# Patient Record
Sex: Female | Born: 1935 | Race: Black or African American | Hispanic: No | Smoking: Never smoker
Health system: Southern US, Community
[De-identification: ages and names within clinical notes are randomized; demographics above are authoritative.]

## PROBLEM LIST (undated history)

## (undated) DIAGNOSIS — I10 Essential (primary) hypertension: Secondary | ICD-10-CM

## (undated) DIAGNOSIS — D126 Benign neoplasm of colon, unspecified: Secondary | ICD-10-CM

## (undated) DIAGNOSIS — M549 Dorsalgia, unspecified: Secondary | ICD-10-CM

## (undated) DIAGNOSIS — H40219 Acute angle-closure glaucoma, unspecified eye: Secondary | ICD-10-CM

## (undated) DIAGNOSIS — K5730 Diverticulosis of large intestine without perforation or abscess without bleeding: Secondary | ICD-10-CM

## (undated) DIAGNOSIS — K449 Diaphragmatic hernia without obstruction or gangrene: Secondary | ICD-10-CM

## (undated) DIAGNOSIS — M25561 Pain in right knee: Secondary | ICD-10-CM

## (undated) DIAGNOSIS — K219 Gastro-esophageal reflux disease without esophagitis: Secondary | ICD-10-CM

## (undated) DIAGNOSIS — D510 Vitamin B12 deficiency anemia due to intrinsic factor deficiency: Secondary | ICD-10-CM

## (undated) DIAGNOSIS — I639 Cerebral infarction, unspecified: Secondary | ICD-10-CM

## (undated) DIAGNOSIS — I4891 Unspecified atrial fibrillation: Secondary | ICD-10-CM

## (undated) DIAGNOSIS — N3281 Overactive bladder: Secondary | ICD-10-CM

## (undated) DIAGNOSIS — I69319 Unspecified symptoms and signs involving cognitive functions following cerebral infarction: Secondary | ICD-10-CM

## (undated) DIAGNOSIS — G8929 Other chronic pain: Secondary | ICD-10-CM

## (undated) HISTORY — DX: Acute angle-closure glaucoma, unspecified eye: H40.219

## (undated) HISTORY — DX: Dorsalgia, unspecified: M54.9

## (undated) HISTORY — DX: Gastro-esophageal reflux disease without esophagitis: K21.9

## (undated) HISTORY — DX: Diverticulosis of large intestine without perforation or abscess without bleeding: K57.30

## (undated) HISTORY — PX: TOTAL ABDOMINAL HYSTERECTOMY: SHX209

## (undated) HISTORY — PX: TONSILLECTOMY: SUR1361

## (undated) HISTORY — DX: Diaphragmatic hernia without obstruction or gangrene: K44.9

## (undated) HISTORY — DX: Pain in right knee: M25.561

## (undated) HISTORY — PX: APPENDECTOMY: SHX54

## (undated) HISTORY — DX: Essential (primary) hypertension: I10

## (undated) HISTORY — PX: HIP FRACTURE SURGERY: SHX118

## (undated) HISTORY — DX: Vitamin B12 deficiency anemia due to intrinsic factor deficiency: D51.0

## (undated) HISTORY — DX: Overactive bladder: N32.81

## (undated) HISTORY — DX: Benign neoplasm of colon, unspecified: D12.6

## (undated) HISTORY — DX: Other chronic pain: G89.29

---

## 2000-12-08 ENCOUNTER — Encounter: Payer: Self-pay | Admitting: Family Medicine

## 2000-12-08 ENCOUNTER — Ambulatory Visit (HOSPITAL_COMMUNITY): Admission: RE | Admit: 2000-12-08 | Discharge: 2000-12-08 | Payer: Self-pay | Admitting: Family Medicine

## 2000-12-09 ENCOUNTER — Ambulatory Visit (HOSPITAL_COMMUNITY): Admission: RE | Admit: 2000-12-09 | Discharge: 2000-12-09 | Payer: Self-pay | Admitting: Family Medicine

## 2000-12-09 ENCOUNTER — Encounter: Payer: Self-pay | Admitting: Family Medicine

## 2001-08-16 ENCOUNTER — Encounter: Payer: Self-pay | Admitting: Family Medicine

## 2001-08-16 ENCOUNTER — Ambulatory Visit (HOSPITAL_COMMUNITY): Admission: RE | Admit: 2001-08-16 | Discharge: 2001-08-16 | Payer: Self-pay | Admitting: Family Medicine

## 2002-04-11 ENCOUNTER — Emergency Department (HOSPITAL_COMMUNITY): Admission: EM | Admit: 2002-04-11 | Discharge: 2002-04-11 | Payer: Self-pay | Admitting: Emergency Medicine

## 2002-05-20 ENCOUNTER — Ambulatory Visit (HOSPITAL_COMMUNITY): Admission: RE | Admit: 2002-05-20 | Discharge: 2002-05-20 | Payer: Self-pay | Admitting: Family Medicine

## 2002-05-20 ENCOUNTER — Encounter: Payer: Self-pay | Admitting: Family Medicine

## 2003-04-21 ENCOUNTER — Ambulatory Visit (HOSPITAL_COMMUNITY): Admission: RE | Admit: 2003-04-21 | Discharge: 2003-04-21 | Payer: Self-pay | Admitting: Family Medicine

## 2003-04-21 ENCOUNTER — Encounter: Payer: Self-pay | Admitting: Family Medicine

## 2003-07-20 ENCOUNTER — Ambulatory Visit (HOSPITAL_COMMUNITY): Admission: RE | Admit: 2003-07-20 | Discharge: 2003-07-20 | Payer: Self-pay | Admitting: Family Medicine

## 2003-07-24 ENCOUNTER — Encounter: Payer: Self-pay | Admitting: Orthopedic Surgery

## 2003-09-20 ENCOUNTER — Ambulatory Visit (HOSPITAL_COMMUNITY): Admission: RE | Admit: 2003-09-20 | Discharge: 2003-09-20 | Payer: Self-pay | Admitting: Internal Medicine

## 2003-09-20 DIAGNOSIS — K5730 Diverticulosis of large intestine without perforation or abscess without bleeding: Secondary | ICD-10-CM

## 2003-09-20 HISTORY — DX: Diverticulosis of large intestine without perforation or abscess without bleeding: K57.30

## 2003-11-03 ENCOUNTER — Ambulatory Visit (HOSPITAL_COMMUNITY): Admission: RE | Admit: 2003-11-03 | Discharge: 2003-11-03 | Payer: Self-pay | Admitting: Obstetrics and Gynecology

## 2004-09-06 ENCOUNTER — Ambulatory Visit (HOSPITAL_COMMUNITY): Admission: RE | Admit: 2004-09-06 | Discharge: 2004-09-06 | Payer: Self-pay | Admitting: Family Medicine

## 2004-09-10 ENCOUNTER — Ambulatory Visit (HOSPITAL_COMMUNITY): Admission: RE | Admit: 2004-09-10 | Discharge: 2004-09-10 | Payer: Self-pay | Admitting: Family Medicine

## 2004-09-23 ENCOUNTER — Ambulatory Visit (HOSPITAL_COMMUNITY): Admission: RE | Admit: 2004-09-23 | Discharge: 2004-09-23 | Payer: Self-pay | Admitting: Family Medicine

## 2004-10-29 ENCOUNTER — Ambulatory Visit (HOSPITAL_COMMUNITY): Admission: RE | Admit: 2004-10-29 | Discharge: 2004-10-29 | Payer: Self-pay | Admitting: Family Medicine

## 2004-11-01 ENCOUNTER — Ambulatory Visit (HOSPITAL_COMMUNITY): Admission: RE | Admit: 2004-11-01 | Discharge: 2004-11-01 | Payer: Self-pay | Admitting: Thoracic Surgery

## 2004-12-09 ENCOUNTER — Ambulatory Visit (HOSPITAL_COMMUNITY): Admission: RE | Admit: 2004-12-09 | Discharge: 2004-12-09 | Payer: Self-pay | Admitting: Family Medicine

## 2004-12-17 ENCOUNTER — Ambulatory Visit (HOSPITAL_COMMUNITY): Admission: RE | Admit: 2004-12-17 | Discharge: 2004-12-17 | Payer: Self-pay | Admitting: Family Medicine

## 2005-01-01 ENCOUNTER — Encounter: Admission: RE | Admit: 2005-01-01 | Discharge: 2005-01-01 | Payer: Self-pay | Admitting: Oncology

## 2005-01-01 ENCOUNTER — Ambulatory Visit (HOSPITAL_COMMUNITY): Payer: Self-pay | Admitting: Oncology

## 2005-01-01 ENCOUNTER — Encounter (HOSPITAL_COMMUNITY): Admission: RE | Admit: 2005-01-01 | Discharge: 2005-01-31 | Payer: Self-pay | Admitting: Oncology

## 2005-01-23 ENCOUNTER — Ambulatory Visit (HOSPITAL_COMMUNITY): Admission: RE | Admit: 2005-01-23 | Discharge: 2005-01-23 | Payer: Self-pay | Admitting: Family Medicine

## 2005-02-07 ENCOUNTER — Ambulatory Visit (HOSPITAL_COMMUNITY): Admission: RE | Admit: 2005-02-07 | Discharge: 2005-02-07 | Payer: Self-pay | Admitting: Thoracic Surgery

## 2005-03-17 ENCOUNTER — Ambulatory Visit: Payer: Self-pay | Admitting: Orthopedic Surgery

## 2005-03-31 ENCOUNTER — Ambulatory Visit: Payer: Self-pay | Admitting: Orthopedic Surgery

## 2005-08-20 ENCOUNTER — Encounter: Admission: RE | Admit: 2005-08-20 | Discharge: 2005-08-20 | Payer: Self-pay | Admitting: Thoracic Surgery

## 2005-12-03 ENCOUNTER — Ambulatory Visit (HOSPITAL_COMMUNITY): Admission: RE | Admit: 2005-12-03 | Discharge: 2005-12-03 | Payer: Self-pay | Admitting: Family Medicine

## 2006-02-18 ENCOUNTER — Encounter: Admission: RE | Admit: 2006-02-18 | Discharge: 2006-02-18 | Payer: Self-pay | Admitting: Thoracic Surgery

## 2006-08-20 ENCOUNTER — Ambulatory Visit (HOSPITAL_COMMUNITY): Admission: RE | Admit: 2006-08-20 | Discharge: 2006-08-20 | Payer: Self-pay | Admitting: Family Medicine

## 2006-08-31 ENCOUNTER — Ambulatory Visit (HOSPITAL_COMMUNITY): Admission: RE | Admit: 2006-08-31 | Discharge: 2006-08-31 | Payer: Self-pay | Admitting: Family Medicine

## 2006-09-10 ENCOUNTER — Encounter: Admission: RE | Admit: 2006-09-10 | Discharge: 2006-09-10 | Payer: Self-pay | Admitting: Family Medicine

## 2006-09-25 ENCOUNTER — Encounter: Admission: RE | Admit: 2006-09-25 | Discharge: 2006-09-25 | Payer: Self-pay | Admitting: Family Medicine

## 2006-10-09 ENCOUNTER — Encounter: Admission: RE | Admit: 2006-10-09 | Discharge: 2006-10-09 | Payer: Self-pay | Admitting: Family Medicine

## 2006-10-20 IMAGING — CT CT CHEST W/ CM
1 of 2 series · 14 of 30 positions shown, 18 images · IV contrast (CONTRAST)
Comparison: none

HISTORY: Lung nodule

[Series 7530: — · axial · 0.63mm/px · z∈[+1496,+1802]mm · 14 of 73 slices shown, 18 images]
[im 6/73  mediastinal]
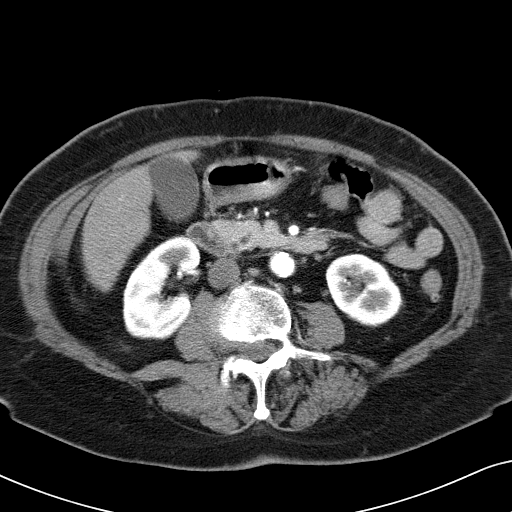
[im 6/73  lung]
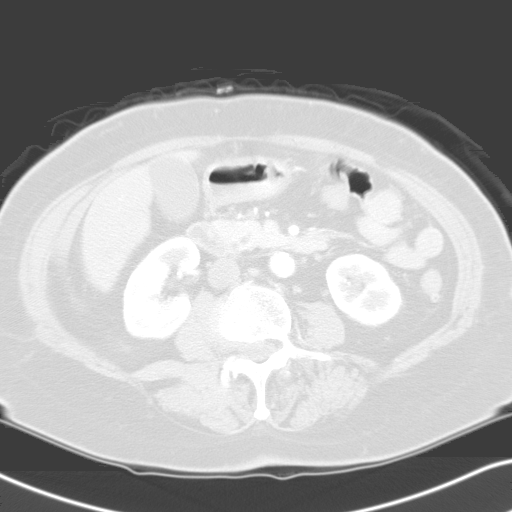
[im 11/73  lung]
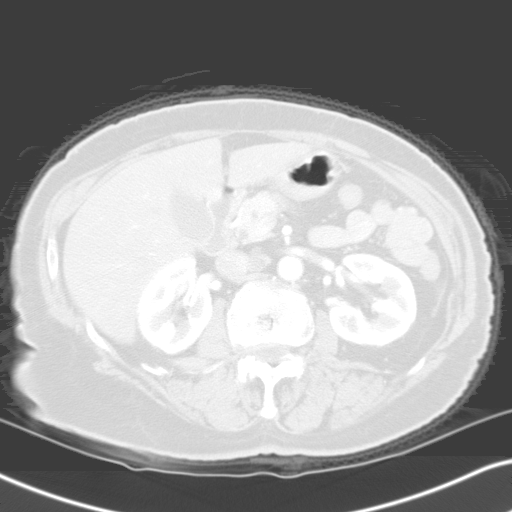
[im 16/73  lung]
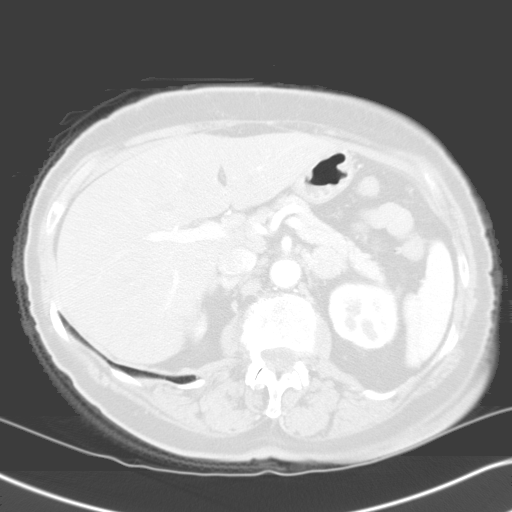
[im 21/73  lung]
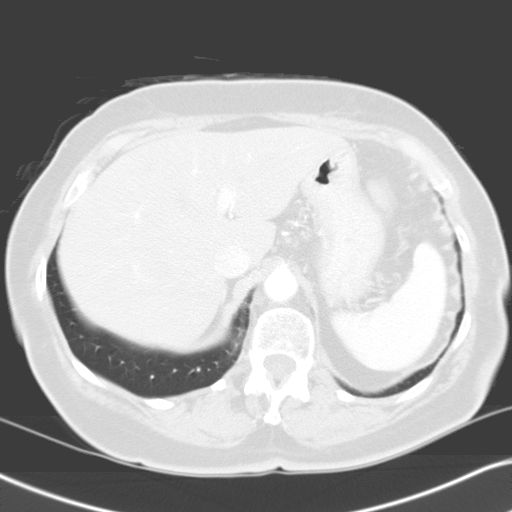
[im 26/73  mediastinal]
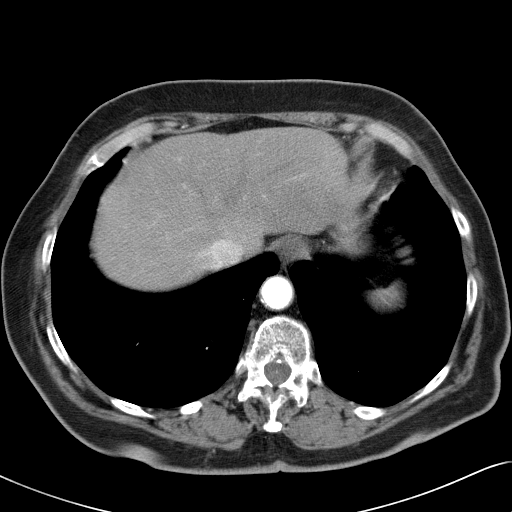
[im 26/73  lung]
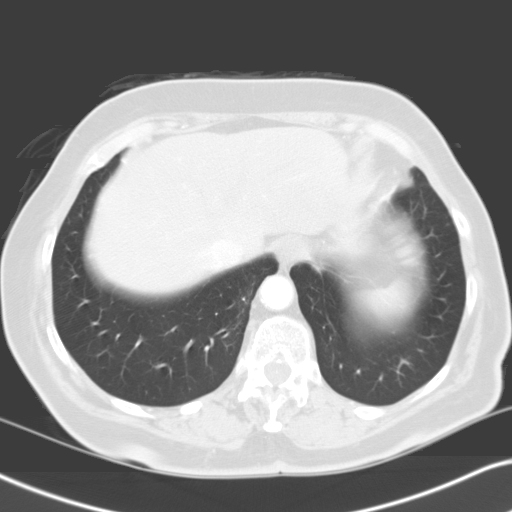
[im 31/73  lung]
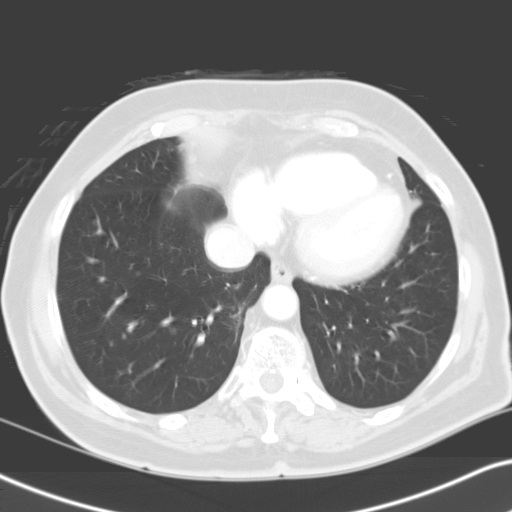
[im 35/73  lung]
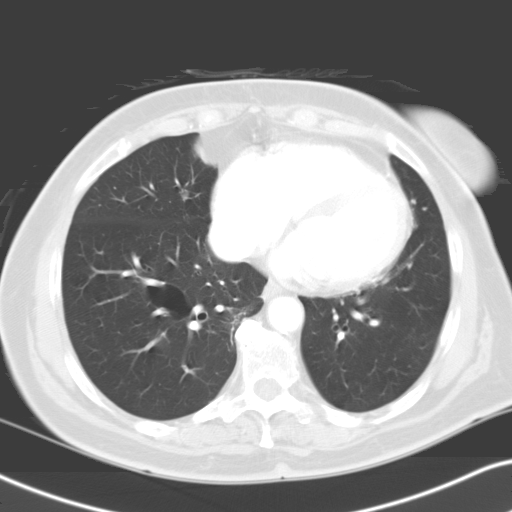
[im 37/73  lung]
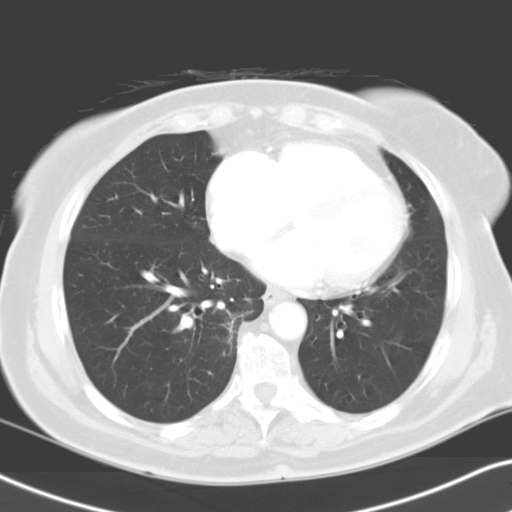
[im 42/73  mediastinal]
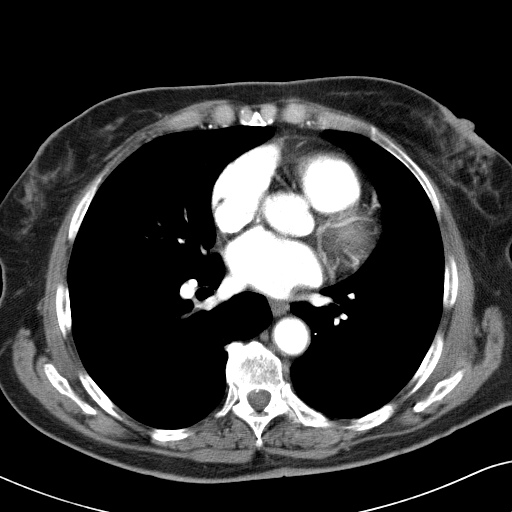
[im 42/73  lung]
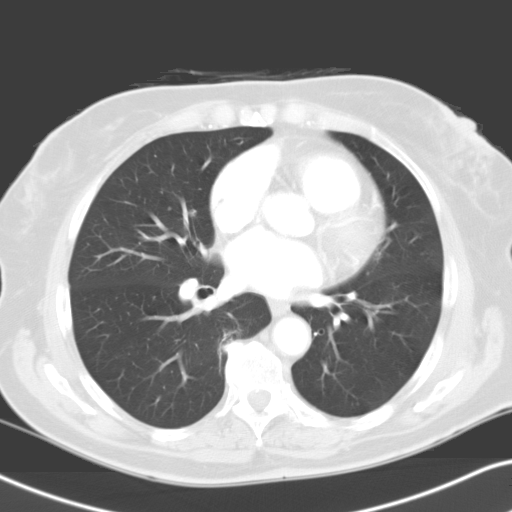
[im 47/73  lung]
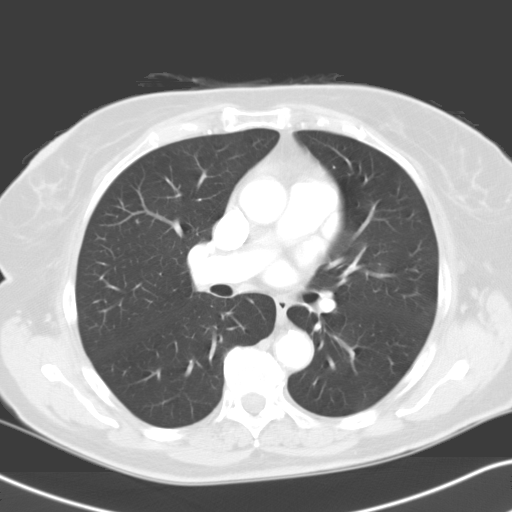
[im 52/73  lung]
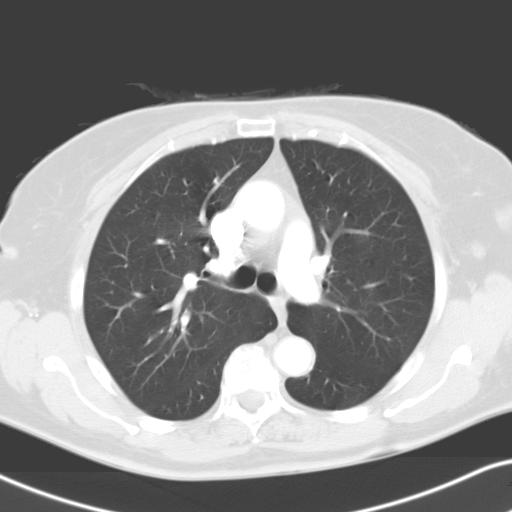
[im 57/73  lung]
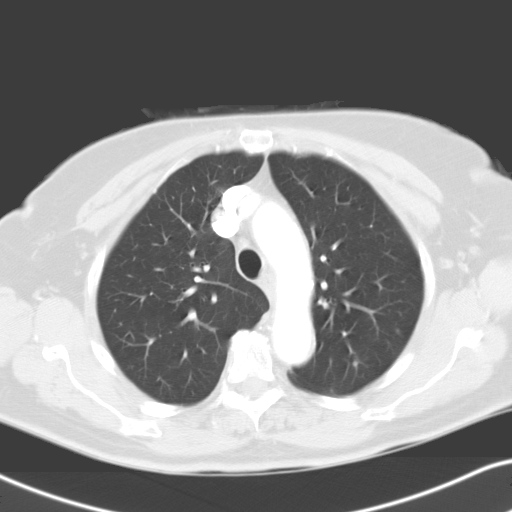
[im 62/73  mediastinal]
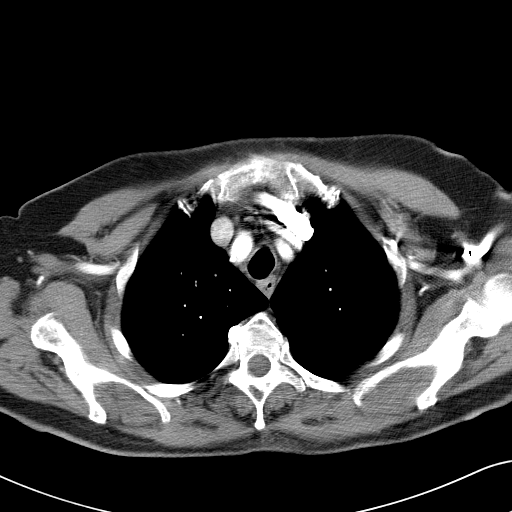
[im 62/73  lung]
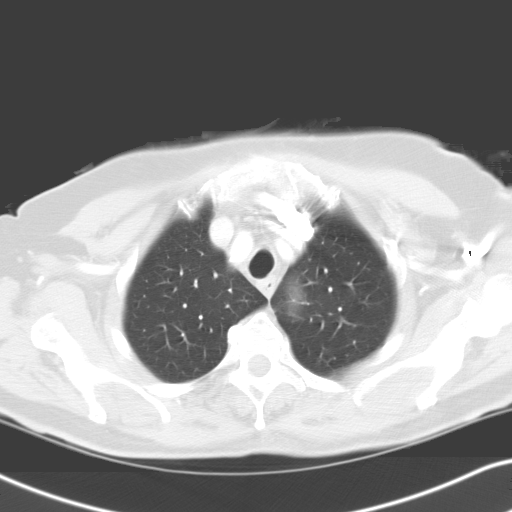
[im 67/73  lung]
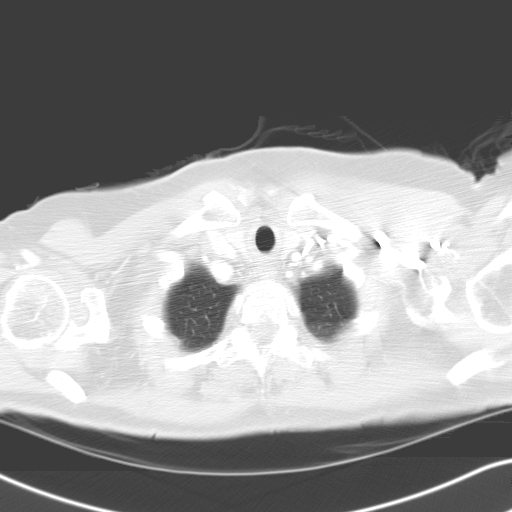

[14 of 30 positions shown; findings below may reference images not displayed]

CT CHEST WITH CONTRAST:

Multidetector helical CT imaging of chest performed.
Exam utilized 100 cc Qmnipaque-MDD.
Correlation made with prior chest radiograph of 09/06/2004, and prior CT abdomen
of 09/10/2004.

Multiple right middle lobe pulmonary nodules seen.
Largest measures 13 x 11 mm diameter, image 35, corresponding in position with
that seen in the preceding CT abdomen of 09/10/2004.
These are indeterminate for malignancy and tissue diagnosis recommended.
The remaining nodules measure 6 mm diameter, in the age 40, 5 mm diameter image
33, and 2 mm diameter image 32.
Remaining lungs clear.
Small bleb is seen centrally in the right lower lobe.
No pulmonary infiltrate or pleural effusion.
Bilateral normal sized axillary lymph nodes.
No mediastinal adenopathy.
Enlarged aortocaval lymph node in the upper abdomen, [DATE] x 1.2 cm, image 64.
Left adrenal mass again identified, 2.5 x 1.9 cm, image 57.
Remainder of upper abdomen unremarkable.
IMPRESSION: 4 right middle lobe nodules, cannot exclude malignancy. Tissue diagnosis
recommended. The small size of these lesions makes these more likely to be
amenable to biopsy via VATS than percutaneously.
Stable left adrenal mass.
Enlarged aortocaval lymph nodes.

## 2006-11-04 ENCOUNTER — Ambulatory Visit (HOSPITAL_COMMUNITY): Admission: RE | Admit: 2006-11-04 | Discharge: 2006-11-04 | Payer: Self-pay | Admitting: Family Medicine

## 2007-01-13 ENCOUNTER — Inpatient Hospital Stay (HOSPITAL_COMMUNITY): Admission: EM | Admit: 2007-01-13 | Discharge: 2007-01-21 | Payer: Self-pay | Admitting: *Deleted

## 2007-01-13 ENCOUNTER — Ambulatory Visit: Payer: Self-pay | Admitting: Cardiology

## 2007-01-13 ENCOUNTER — Ambulatory Visit: Payer: Self-pay | Admitting: Orthopedic Surgery

## 2007-01-15 ENCOUNTER — Encounter: Payer: Self-pay | Admitting: Orthopedic Surgery

## 2007-01-21 ENCOUNTER — Inpatient Hospital Stay: Admission: AD | Admit: 2007-01-21 | Discharge: 2007-04-05 | Payer: Self-pay | Admitting: Internal Medicine

## 2007-02-04 ENCOUNTER — Ambulatory Visit (HOSPITAL_COMMUNITY): Admission: RE | Admit: 2007-02-04 | Discharge: 2007-02-04 | Payer: Self-pay | Admitting: Internal Medicine

## 2007-02-17 ENCOUNTER — Ambulatory Visit (HOSPITAL_COMMUNITY): Admission: RE | Admit: 2007-02-17 | Discharge: 2007-02-17 | Payer: Self-pay | Admitting: Internal Medicine

## 2007-02-18 ENCOUNTER — Ambulatory Visit: Payer: Self-pay | Admitting: Orthopedic Surgery

## 2007-04-22 ENCOUNTER — Ambulatory Visit: Payer: Self-pay | Admitting: Orthopedic Surgery

## 2007-07-01 ENCOUNTER — Ambulatory Visit: Payer: Self-pay | Admitting: Orthopedic Surgery

## 2007-07-01 DIAGNOSIS — IMO0002 Reserved for concepts with insufficient information to code with codable children: Secondary | ICD-10-CM

## 2007-07-01 DIAGNOSIS — M25569 Pain in unspecified knee: Secondary | ICD-10-CM

## 2007-09-22 ENCOUNTER — Ambulatory Visit: Payer: Self-pay | Admitting: Orthopedic Surgery

## 2007-09-22 DIAGNOSIS — M549 Dorsalgia, unspecified: Secondary | ICD-10-CM | POA: Insufficient documentation

## 2007-11-16 ENCOUNTER — Ambulatory Visit (HOSPITAL_COMMUNITY): Admission: RE | Admit: 2007-11-16 | Discharge: 2007-11-16 | Payer: Self-pay | Admitting: Family Medicine

## 2008-01-18 ENCOUNTER — Encounter: Payer: Self-pay | Admitting: Orthopedic Surgery

## 2008-04-18 ENCOUNTER — Ambulatory Visit (HOSPITAL_COMMUNITY): Admission: RE | Admit: 2008-04-18 | Discharge: 2008-04-18 | Payer: Self-pay | Admitting: Family Medicine

## 2008-09-07 ENCOUNTER — Ambulatory Visit: Payer: Self-pay | Admitting: Orthopedic Surgery

## 2008-09-07 DIAGNOSIS — M1710 Unilateral primary osteoarthritis, unspecified knee: Secondary | ICD-10-CM | POA: Insufficient documentation

## 2008-10-12 ENCOUNTER — Ambulatory Visit (HOSPITAL_COMMUNITY): Admission: RE | Admit: 2008-10-12 | Discharge: 2008-10-12 | Payer: Self-pay | Admitting: Family Medicine

## 2008-12-22 ENCOUNTER — Ambulatory Visit (HOSPITAL_COMMUNITY): Admission: RE | Admit: 2008-12-22 | Discharge: 2008-12-22 | Payer: Self-pay | Admitting: Family Medicine

## 2008-12-22 ENCOUNTER — Encounter: Payer: Self-pay | Admitting: Orthopedic Surgery

## 2009-01-02 ENCOUNTER — Ambulatory Visit: Payer: Self-pay | Admitting: Orthopedic Surgery

## 2009-04-16 ENCOUNTER — Ambulatory Visit: Payer: Self-pay | Admitting: Orthopedic Surgery

## 2009-09-20 ENCOUNTER — Ambulatory Visit: Payer: Self-pay | Admitting: Orthopedic Surgery

## 2009-09-20 DIAGNOSIS — M5430 Sciatica, unspecified side: Secondary | ICD-10-CM | POA: Insufficient documentation

## 2009-09-20 DIAGNOSIS — M25559 Pain in unspecified hip: Secondary | ICD-10-CM | POA: Insufficient documentation

## 2010-07-30 ENCOUNTER — Ambulatory Visit (HOSPITAL_COMMUNITY): Admission: RE | Admit: 2010-07-30 | Discharge: 2010-07-30 | Payer: Self-pay | Admitting: Cardiovascular Disease

## 2010-07-30 ENCOUNTER — Encounter (INDEPENDENT_AMBULATORY_CARE_PROVIDER_SITE_OTHER): Payer: Self-pay | Admitting: Cardiovascular Disease

## 2010-09-22 ENCOUNTER — Encounter: Payer: Self-pay | Admitting: Otolaryngology

## 2010-10-01 NOTE — Assessment & Plan Note (Signed)
Summary: rt hip pain post THA needs xrays/bcbs/medicare.cbt   Visit Type:  Follow-up  CC:  right hip pain.  History of Present Illness: This is a 77 year old female who had a RIGHT hip fracture which was treated with gamma nail (short version) on Jan 15, 2007.  She is currently on Tylox for chronic pain.  She fell on her hip 3 weeks ago when her legs gave out on her.  She landed on her back area.  She is at a limp since that fall.  She has pain which radiates from her back across the hip into the knee and into the shin.  But there is no numbness just pain.  She denies any groin pain.  Physical exam She is actually groomed, oriented x3 pleasant mood ambulating with a cane she has tenderness in her RIGHT lower back, RIGHT gluteal region.  She is nontender over the greater trochanter.  The incision looks great.  Range of motion of the hip remains normal.  The hip remained stable.  Muscle tone and strength in the thigh normal.  She is normal temperature and the limb.  Lymph nodes are normal in the RIGHT groin and sensation is normal in the RIGHT leg and reflexes are normal with no pathologic signs on the RIGHT lower extremity straight leg raise reproduces some of her symptoms.  Her coordination balance may be a little off but this is most likely related to age  X-rays 3 views of the spine and an AP pelvis show that the RIGHT hip implant is in excellent alignment with no signs of loosening or change.  Impression normal appearance for a gamma nail with healing of the fracture  The spine view show a degenerative disc disease with scoliosis and kyphosis.  This is chronic.  There is no change from previous films.  Impression stable degenerative scoliosis.  Assessment bruised or contused sciatic nerve  Recommend observation.      Allergies (verified): No Known Drug Allergies  Past History:  Past Surgical History: Last updated: 06/29/2007 Appendectomy Total Abdominal  Hysterectomy Tonsillectomy OTIF gamma nail right hip Dr. Romeo Apple  Social History: she lives around and drives and still takes care of herself  Review of Systems      See HPI   Impression & Recommendations:  Problem # 1:  SCIATICA (ICD-724.3) Assessment New  x-rays are obtained of the lumbar spine in complaint to previous films. There is a scoliosis and a severe degenerative disc disease throughout the lumbar spine kyphosis and a L1, old compression fracture. The gamma nail in the RIGHT hip is stable. No change in position.  The following medications were removed from the medication list:    Lorcet Plus 7.5-650 Mg Tabs (Hydrocodone-acetaminophen) ..... One by mouth q 4 hrs as needed pain Her updated medication list for this problem includes:    Nabumetone 500 Mg Tabs (Nabumetone) ..... One by mouth bid    Tylox 5-500 Mg Caps (Oxycodone-acetaminophen) ..... One by mouth q 4 hrs as needed pain  Orders: Est. Patient Level IV (57846) Lumbosacral Spine ,2/3 views (72100)  Problem # 2:  HIP PAIN (ICD-719.45) Assessment: Comment Only  The following medications were removed from the medication list:    Lorcet Plus 7.5-650 Mg Tabs (Hydrocodone-acetaminophen) ..... One by mouth q 4 hrs as needed pain Her updated medication list for this problem includes:    Nabumetone 500 Mg Tabs (Nabumetone) ..... One by mouth bid    Tylox 5-500 Mg Caps (Oxycodone-acetaminophen) ..... One by  mouth q 4 hrs as needed pain  Orders: Pelvis x-ray, 1/2 views (16109)  Medications Added to Medication List This Visit: 1)  Tylox 5-500 Mg Caps (Oxycodone-acetaminophen) .... One by mouth q 4 hrs as needed pain  Patient Instructions: 1)  You have a bruised sciatic nerve.  The hip implant is fine.  2)  Return as needed.

## 2010-11-18 ENCOUNTER — Other Ambulatory Visit (HOSPITAL_COMMUNITY): Payer: Self-pay | Admitting: Family Medicine

## 2010-11-18 DIAGNOSIS — Z139 Encounter for screening, unspecified: Secondary | ICD-10-CM

## 2010-11-21 ENCOUNTER — Ambulatory Visit (HOSPITAL_COMMUNITY)
Admission: RE | Admit: 2010-11-21 | Discharge: 2010-11-21 | Disposition: A | Discharge status: Home / Self Care | Payer: Medicare Other | Source: Ambulatory Visit | Attending: Family Medicine | Admitting: Family Medicine

## 2010-11-21 DIAGNOSIS — Z1231 Encounter for screening mammogram for malignant neoplasm of breast: Secondary | ICD-10-CM | POA: Insufficient documentation

## 2010-11-21 DIAGNOSIS — Z139 Encounter for screening, unspecified: Secondary | ICD-10-CM

## 2010-12-02 ENCOUNTER — Encounter: Payer: Self-pay | Admitting: Urgent Care

## 2010-12-02 ENCOUNTER — Ambulatory Visit (INDEPENDENT_AMBULATORY_CARE_PROVIDER_SITE_OTHER): Payer: Medicare Other | Admitting: Urgent Care

## 2010-12-02 VITALS — BP 159/80 | HR 70 | Temp 99.3°F | Ht 65.0 in | Wt 116.0 lb

## 2010-12-02 DIAGNOSIS — R630 Anorexia: Secondary | ICD-10-CM

## 2010-12-02 DIAGNOSIS — R634 Abnormal weight loss: Secondary | ICD-10-CM

## 2010-12-02 NOTE — Assessment & Plan Note (Signed)
77-year-old female with 20+ pound weight loss over the past year. This has been unintentional, although she has had decreased caloric intake over the last 3 years since she had a hip fracture. Last colonoscopy approximately 7 years ago. She is having some anorexia. She denies any other chronic GI problems. She will need EGD and to rule out peptic ulcer disease occult malignancy and colonoscopy to rule out occult malignancy with Dr. Rourk.   I have discussed risks & benefits which include, but are not limited to, bleeding, infection, perforation & drug reaction.  The patient agrees with this plan & written consent will be obtained.    

## 2010-12-02 NOTE — Assessment & Plan Note (Deleted)
77 year old female with 20+ pound weight loss over the past year. This has been unintentional, although she has had decreased caloric intake over the last 3 years since she had a hip fracture. Last colonoscopy approximately 7 years ago. She is having some anorexia. She denies any other chronic GI problems. She will need EGD and to rule out peptic ulcer disease occult malignancy and colonoscopy to rule out occult malignancy with Dr. Jena Gauss.   I have discussed risks & benefits which include, but are not limited to, bleeding, infection, perforation & drug reaction.  The patient agrees with this plan & written consent will be obtained.

## 2010-12-02 NOTE — Patient Instructions (Addendum)
Start Multivitamin daily Drink Boost, Ensure or Carnation Instant Breakfast if you don't eat snacks between meals Try & eat 3 meals and at least 2 snacks per day  High Protein/High Calorie Diet A high protein/high calorie diet increases the amount of protein and calories in the foods you eat. You may need more protein and calories in your diet because of illness, surgery, injury, weight loss, or have a poor appetite. Eating protein and calorie rich foods can help you gain weight, heal and recover after illness.  SERVING SIZES: Measuring foods and serving sizes helps to make sure you are getting the right amount of food. The list below tells how big or small some common serving sizes are.   1 ounce (oz) of cheese or 1  oz cheese 3 or 4 stacked dice   2-3 oz cooked meat Deck of cards   1 teaspoon (tsp) Tip of little finger   1 tablespoon (Tbsp) Tip of thumb   2 Tbsp Golf ball    Cup Half of a fist   1 Cup A fist  HIGH PROTEIN FOODS: Dairy Sources  Whole milk.  Whole milk yogurt.   Powdered milk.   Cheese.  [employer].   Instant breakfast products.  Eggnog.   Tips for adding to/using in diet:  Use whole milk when making hot cereal, puddings, soups and hot cocoa.   Add powdered milk to baked goods and smoothies or milkshakes.   Make whole milk yogurt parfaits by adding granola, fruit or nuts.   Add cheese to sandwiches, pastas, soups and casseroles.   Add fruit to cottage cheese.  Meat Sources  Beef, pork and poultry.  Fish and seafood.   Peanut butter.   Dried beans.  Eggs.   Tips for adding to/using in diet:  Make meat and cheese omelets   Add eggs to salads and baked goods   Add fish and seafood to salads   Add meat and poultry to casseroles, salads, and soups   Use peanut butter as a topping for pretzels, celery, crackers or add to baked goods   Use beans in casseroles and dips or spreads  GENERAL GUIDELINES TO INCREASE CALORIES:  Replace  calorie free drinks with calorie containing drinks such as milk, fruit juices, regular soda, milkshakes, and hot chocolate.   Try to eat 6 small meals instead of 3 large meals each day.   Keep snacks handy such as nuts, trail mixes, dried fruit, and yogurt.   Choose foods with sauces and gravies to increase calories.   Add dried fruits, honey, and half and half to hot or cold cereal.   Add extra fats when possible such as butter, sour cream, cream cheese, and salad dressings   Sprinkle or add cheese to foods often.   Consider adding a clear liquid nutritional supplement to your diet. Your caregiver can give you recommendations.  HIGH CALORIE FOODS: Grain/Starch Sources  Baked goods such as muffins and quick breads.  Croissants.   Pancakes and waffles.  Vegetable Sources  Sauted vegetables in oil.   Fried vegetables.   Salad greens with regular salad dressing or vinegar and oil.  Fruit Sources  Dried fruit.  Canned fruit in syrup.  Fruit juice.  Fat Sources  Avocado.  Butter or margarine.   Whipped cream.   Mayonnaise.  Salad dressing.   Peanuts and mixed nuts.  Cream cheese and sour cream.   Sweets and Dessert Sources  Cake.  Cookies.   Pie.  Ice cream.   Donuts and pastries.   Protein and meal replacement bars.  Jam, preserves and jelly.   Candy bars.   Chocolate.   Chocolate, caramel, or various syrups.   Document Released: 08/18/2005 Document Re-Released: 06/14/2009 [facility] Patient Information 2011 [place].

## 2010-12-02 NOTE — Progress Notes (Signed)
Referring Provider: Colette Ribas, MD Primary Care Physician:  Colette Ribas, MD  Chief Complaint  Patient presents with  . Weight Loss    unexplained    HPI:  Renee Leblanc is a 77 y.o. female here as a referral from Dr. Phillips Odor for unintentional wt loss of 25# in last year.  Pt only eats BID because she awakens at 10am, eats breakfast & then dinner 5-6pm.  Does not snack throughout the day.  c/o anorexia.  Denies nausea, vomiting, heartburn or indigestion. Takes prilosec 20mg  daily.  Denies dysphagia or odynophagia.  BM normal once daily, denies rectal bleeding or melena.  Takes colace 100mg  daily x 3 yrs.  C/o increased urinary frequency.  Denies any hx DM.   Past Medical History  Diagnosis Date  . Chronic back pain     3 yrs  . Knee pain, right   . Pernicious anemia   . Overactive bladder   . Diverticulosis of colon 09/20/2003    Last colonoscopy Dr Jena Gauss , inflammatory polyps  . Hemorrhoids   . Acute angle-closure glaucoma   . Hypertension   . GERD (gastroesophageal reflux disease)   . Osteoporosis     Past Surgical History  Procedure Date  . Appendectomy   . Total abdominal hysterectomy   . Tonsillectomy   . Hip fracture surgery     Current Outpatient Prescriptions  Medication Sig Dispense Refill  . alendronate (FOSAMAX) 70 MG tablet Take 70 mg by mouth every 7 (seven) days. Take with a full glass of water on an empty stomach.       . calcium carbonate 200 MG capsule Take 250 mg by mouth daily.        Marland Kitchen docusate sodium (COLACE) 100 MG capsule Take 100 mg by mouth daily.        . folic acid (FOLVITE) 1 MG tablet Take 1 mg by mouth daily.        . furosemide (LASIX) 20 MG tablet Take 20 mg by mouth daily.        . irbesartan (AVAPRO) 300 MG tablet Take 300 mg by mouth at bedtime.        Marland Kitchen latanoprost (XALATAN) 0.005 % ophthalmic solution Place 1 drop into both eyes at bedtime.        . lidocaine (LIDODERM) 5 % Place 1 patch onto the skin daily. Remove  & Discard patch within 12 hours or as directed by MD       . metoprolol (LOPRESSOR) 50 MG tablet Take 50 mg by mouth daily.        Marland Kitchen omeprazole (PRILOSEC) 20 MG capsule Take 20 mg by mouth daily.        Marland Kitchen oxyCODONE-acetaminophen (TYLOX) 5-500 MG per capsule Take 1 capsule by mouth every 4 (four) hours as needed.        . potassium chloride SA (K-DUR,KLOR-CON) 20 MEQ tablet Take 20 mEq by mouth daily.        . Timolol Maleate (TIMOPTIC OP) Apply to eye.        . Vitamin B1-B12 5.5-0.0125 MG/5ML SOLN Inject 1 Syringe as directed every 30 (thirty) days.        . Bisoprolol-Hydrochlorothiazide (ZIAC PO) Take by mouth.        . nabumetone (RELAFEN) 500 MG tablet Take 500 mg by mouth 2 (two) times daily.        Marland Kitchen DISCONTD: AmLODIPine Besylate (NORVASC PO) Take by mouth.  Allergies as of 12/02/2010  . (No Known Allergies)    Family History:  There is no known family history of colorectal carcinoma , liver disease, or inflammatory bowel disease.   History   Social History  . Marital Status: Widowed    Spouse Name: N/A    Number of Children: 1  . Years of Education: N/A   Occupational History  . retired    Social History Main Topics  . Smoking status: Never Smoker   . Smokeless tobacco: Never Used  . Alcohol Use: Yes     wine each night before dinner  . Drug Use: No  . Sexually Active: No   Other Topics Concern  . Not on file   Social History Narrative   Lives in [place] alone    Review of Systems: Gen: Denies any fever, chills, sweats, anorexia, fatigue, weakness, malaise, and sleep disorder CV: Denies chest pain, angina, palpitations, syncope, orthopnea, PND, peripheral edema, and claudication. Resp: Denies dyspnea at rest, dyspnea with exercise, cough, sputum, wheezing, coughing up blood, and pleurisy. GI: Denies vomiting blood, jaundice, and fecal incontinence.   Denies dysphagia or odynophagia. GU : c/o OAB and increased urinary frequency.  Denies urinary  burning, blood in urine,urinary hesitancy, nocturnal urination, and urinary incontinence. MS: Denies joint pain, limitation of movement, and swelling, stiffness, low back pain, extremity pain. Denies muscle weakness, cramps, atrophy.  Derm: Denies rash, itching, dry skin, hives, moles, warts, or unhealing ulcers.  Psych: Denies depression, anxiety, memory loss, suicidal ideation, hallucinations, paranoia, and confusion. Heme: Denies bruising, bleeding, and enlarged lymph nodes.  Physical Exam: BP 159/80  Pulse 70  Temp 99.3 F (37.4 C)  Ht 5\' 5"  (1.651 m)  Wt 116 lb (52.617 kg)  BMI 19.30 kg/m2  SpO2 96% General:   Alert,  Well-developed, well-nourished, pleasant and cooperative in NAD Head:  Normocephalic and atraumatic.  Alopecia. Eyes:  Sclera clear, no icterus.   Conjunctiva pink. Ears:  Normal auditory acuity. Nose:  No deformity, discharge,  or lesions. Mouth:  No deformity or lesions, dentition normal. Neck:  Supple; no masses or thyromegaly. Lungs:  Clear throughout to auscultation.   No wheezes, crackles, or rhonchi. No acute distress. Heart:  Regular rate and rhythm; no murmurs, clicks, rubs,  or gallops. Abdomen:  Soft, nontender and nondistended. No masses, hepatosplenomegaly or hernias noted. Normal bowel sounds, without guarding, and without rebound.   Rectal:  Deferred until time of colonoscopy.   Msk:  Symmetrical without gross deformities. Normal posture. Pulses:  Normal pulses noted. Extremities:  Without clubbing or edema. Neurologic:  Alert and  oriented x4;  grossly normal neurologically. Skin:  Intact without significant lesions or rashes. Cervical Nodes:  No significant cervical adenopathy. Psych:  Alert and cooperative. Normal mood and affect.

## 2010-12-03 NOTE — Progress Notes (Signed)
Reviewed by R. Michael Kerstin Crusoe, MD FACP FACG 

## 2010-12-18 ENCOUNTER — Other Ambulatory Visit: Payer: Self-pay | Admitting: Internal Medicine

## 2010-12-18 ENCOUNTER — Ambulatory Visit (HOSPITAL_COMMUNITY)
Admission: RE | Admit: 2010-12-18 | Discharge: 2010-12-18 | Disposition: A | Discharge status: Home / Self Care | Payer: Medicare Other | Source: Ambulatory Visit | Attending: Internal Medicine | Admitting: Internal Medicine

## 2010-12-18 ENCOUNTER — Encounter: Payer: Medicare Other | Admitting: Internal Medicine

## 2010-12-18 DIAGNOSIS — D126 Benign neoplasm of colon, unspecified: Secondary | ICD-10-CM

## 2010-12-18 DIAGNOSIS — R634 Abnormal weight loss: Secondary | ICD-10-CM | POA: Insufficient documentation

## 2010-12-18 DIAGNOSIS — K449 Diaphragmatic hernia without obstruction or gangrene: Secondary | ICD-10-CM

## 2010-12-18 DIAGNOSIS — D131 Benign neoplasm of stomach: Secondary | ICD-10-CM

## 2010-12-18 HISTORY — DX: Benign neoplasm of colon, unspecified: D12.6

## 2010-12-18 HISTORY — DX: Diaphragmatic hernia without obstruction or gangrene: K44.9

## 2010-12-19 ENCOUNTER — Telehealth: Payer: Self-pay

## 2010-12-19 NOTE — Telephone Encounter (Signed)
Noted; lets  make it marinol 2.5 mg orally BID #60 with no refills; o/v

## 2010-12-19 NOTE — Telephone Encounter (Signed)
Informed Harrold Donath at [facility]

## 2010-12-19 NOTE — Telephone Encounter (Signed)
[facility] called [facility] rx Marinol 0.5mg . rx only comes in 2.5mg , 5mg , or 10mg . Pharmacist wants to clarify what dose pt should be on. Op note states 0.5mg . Please advise.

## 2010-12-22 NOTE — Op Note (Signed)
NAME:  Renee Leblanc, Renee Leblanc              ACCOUNT NO.:  [id number]  MEDICAL RECORD NO.:  [id number]           PATIENT TYPE:  O  LOCATION:  [facility]                          FACILITY:  [facility]  PHYSICIAN:  R. Roetta Sessions, M.D. DATE OF BIRTH:  08/20/1917  DATE OF PROCEDURE:  12/18/2010 DATE OF DISCHARGE:                              OPERATIVE REPORT   PROCEDURE:  Diagnostic EGD followed by colonoscopy with biopsy and snare polypectomy.  INDICATIONS FOR PROCEDURE:  A 77 year old lady with 25-pound unintentional weight loss over the past 1 year.  Denies other chronic GI symptoms.  Last colonoscopy 7 years ago.  EGD and colonoscopy now being done.  Risks, benefits, alternatives, limitations have been reviewed previously again at the bedside.  All parties questions answered, all parties agreeable.  PROCEDURE NOTE:  O2 saturation, blood pressure, pulse, respirations were monitored throughout the entire procedure.  CONSCIOUS SEDATION:  Versed 3 mg IV, Demerol 75 mg IV in divided doses.  INSTRUMENT:  Pentax video chip system.  Cetacaine spray for topical pharyngeal anesthesia.  FINDINGS:  EGD examination of tubular esophagus revealed normal- appearing mucosa.  EG junction easily traversed.  Stomach:  Gastric cavity was emptied and insufflated well with air. Thorough examination of gastric mucosa including retroflexion of proximal stomach, esophagogastric junction demonstrated only a small hiatal hernia, couple of tiny fundal gland polyps not manipulated. Pylorus was patent, easily traversed.  Examination of bulb and second portion revealed no abnormalities.  THERAPEUTIC/DIAGNOSTIC MANEUVERS PERFORMED:  None.  The patient tolerated the procedure well and was prepared for colonoscopy.  Digital rectal exam revealed no abnormalities.  Endoscopic findings:  Prep was good.  Colon:  Colonic mucosa was surveyed from the rectosigmoid junction through the left transverse right colon to  the appendiceal orifice, ileocecal valve/cecum.  These structures were well seen and photographed for the record.  From this level, scope was slowly and cautiously withdrawn.  All previously mucosal surfaces were again seen.  The patient was noted to have a two diminutive polyps in the mid ascending colon which were cold biopsied/removed.  There was a 6-mm polyp on a stalk in the mid descending colon which was cold snared, recovered.  Remainder of colonic mucosa appeared normal.  Scope was pulled down to the rectum where a thorough examination of rectal mucosa including retroflexed view of the anal verge demonstrated internal hemorrhoids only.  The patient tolerated this procedure well.  Also cecal withdrawal time 8 minutes.  IMPRESSION: 1. EGD normal, esophagus small hiatal hernia, fundal gland polyp was     not manipulated, otherwise normal stomach, patent pylorus, normal     D1 and D2.  Colonoscopy findings internal hemorrhoids, anal papilla     otherwise normal rectum. 2. Polyps in descending segments removed as described above, otherwise     normal-appearing colon.  I suspect the patient's symptoms of anorexia and weight loss are multifactorial etiology i.e. advanced age change in metabolism, etc. Today's findings are reassuring.  RECOMMENDATIONS: 1. Liberalize oral intake, consider Carnation as breakfast supplements     between meals. 2. One month course of Marinol 0.5 mg orally b.i.d. 3. We would  consider GYN imaging just to make sure that she does not     have any evidence of an occult very neoplasm, etc, that could be an     insidious occult factor in this setting (I doubt). 4. Follow up on path. 5. Follow up appointment with [place] in the office.     Jonathon Bellows, M.D.     RMR/MEDQ  D:  12/18/2010  T:  12/19/2010  Job:  [id number]  cc:   Corrie Mckusick, M.D. Fax: [phone]  Electronically Signed by Lorrin Goodell M.D. on 12/22/2010 02:08:31 PM

## 2010-12-23 NOTE — Telephone Encounter (Signed)
Pt is aware of OV on 01/17/11 @ 1030 w/LSL

## 2010-12-26 ENCOUNTER — Encounter: Payer: Self-pay | Admitting: Internal Medicine

## 2011-01-14 NOTE — Op Note (Signed)
NAME:  Renee Leblanc, Renee Leblanc NO.:  [id number]   MEDICAL RECORD NO.:  [id number]          PATIENT TYPE:  INP   LOCATION:  A206                          FACILITY:  [facility]   PHYSICIAN:  Vickki Hearing, M.D.DATE OF BIRTH:  Sep 01, 1917   DATE OF PROCEDURE:  01/15/2007  DATE OF DISCHARGE:                               OPERATIVE REPORT   HISTORY:  This is an 77 year old female who fell when she was chased by  some dogs and injured her right hip.  She could not walk. She was  brought to the hospital by emergency medical systems and was found to  have a right hip fracture.  She was admitted.  A medical consult was  obtained.  She developed some chest pain, a cardiology consult was  obtained. EKG, enzymes, CT the chest to rule out PE and aortic aneurysm  were all negative. She was cleared for surgery.   PREOPERATIVE DIAGNOSIS:  Intertrochanteric fracture right hip.   POSTOPERATIVE DIAGNOSIS:  Intertrochanteric fracture right hip.   PROCEDURE:  Open treatment internal fixation with intramedullary device,  Gamma nail from the Stryker system, 125 degrees angled nail, 35 mm  locking bolt, 95 mm lag screw, and a proximal locking nut.   SURGEON:  Vickki Hearing, M.D.   ASSISTANT:  None.   ANESTHESIA:  Spinal.   BLOOD LOSS:  Less than 150 mL.   OPERATIVE FINDINGS:  Two part intertrochanteric fracture of the right  hip.   The patient was identified as Renee Leblanc.  Her right hip was marked  for surgery.  I countersigned it.  I checked her x-rays, history and  physical, and agreed that this was a right hip fracture.  She was taken  to the operating room for spinal anesthetic.  The first one was  unsuccessful.  The second one was successful.  She was placed on the  operating fracture table.  Her left leg was placed in a well leg holder,  the right leg placed in traction. Traction and internal rotation were  used to reduce the fracture and radiographs confirmed the  fracture  reduction. The time out procedure was completed and the antibiotics were  started on time.   A straight incision was made at the tip of the trochanter and extended  proximally.  Subcutaneous tissue and fascia were divided in line with  the skin incision.  Blunt muscular dissection was done until the greater  trochanter was palpated. The curved awl was passed into the femoral  canal.  A guidewire was passed and confirmed by x-ray. The reamer was  passed over the guidewire and the nail was passed over the guidewire.  The guidewire was removed.  A stab incision was made on the lateral  thigh.  A drill sleeve was placed against the bone. A guidewire was  placed just at the inferior third of the femoral head and middle on the  lateral view and measured a 95. We set the triple reamer for 95, over  reamed the wire to 95 mm, passed the lag screw over the guidewire, and  then passed the  locking nut proximally, backed it off a half a turn to  allow some sliding.  We then removed the screw driver handle, made a  stab wound more distal, passed a drill sleeve, drilled across the  locking screw site, and passed a screw after drilling to a depth of 35  mm.  We passed the screw, secured it, took x-rays of everything, it was  in excellent position.  We irrigated all the wounds, closed in layered  fashion, staples for the skin, 30 mL of Marcaine with epinephrine  injected subcu and deep. Dressings were applied and the patient was  taken to recovery room in stable condition.  She is full weight bearing.  Lovenox and early mobilization for DVT prophylaxis.  Sequential  compression devices, as well. Continue to follow medically.      Vickki Hearing, M.D.  Electronically Signed     SEH/MEDQ  D:  01/15/2007  T:  01/16/2007  Job:  [id number]

## 2011-01-14 NOTE — Discharge Summary (Signed)
NAME:  Renee Leblanc, Renee Leblanc              ACCOUNT NO.:  [id number]   MEDICAL RECORD NO.:  [id number]          PATIENT TYPE:  INP   LOCATION:  A340                          FACILITY:  [facility]   PHYSICIAN:  Vickki Hearing, M.D.DATE OF BIRTH:  October 30, 1916   DATE OF ADMISSION:  01/13/2007  DATE OF DISCHARGE:  LH                               DISCHARGE SUMMARY   ADDENDUM   I have spoken with the medical physician regarding D-dimer.  The patient  clinically not symptomatic for clot or PE, however, the test was  positive and a CT scan will be done to rule out PE.  If it is normal,  then the patient can be discharged today.  If note, then we will go to a  dose-related Lovenox for treatment.      Vickki Hearing, M.D.  Electronically Signed     SEH/MEDQ  D:  01/20/2007  T:  01/20/2007  Job:  [id number]

## 2011-01-14 NOTE — Consult Note (Signed)
Renee Leblanc, Renee Leblanc              ACCOUNT NO.:  [id number]   MEDICAL RECORD NO.:  [id number]          PATIENT TYPE:  INP   LOCATION:  A340                          FACILITY:  [facility]   PHYSICIAN:  Gerrit Friends. Dietrich Pates, MD, FACCDATE OF BIRTH:  [birth year]   DATE OF CONSULTATION:  01/15/2007  DATE OF DISCHARGE:                                 CONSULTATION   PRIMARY CARE PHYSICIAN:  Dr. Nobie Putnam.   HISTORY OF PRESENT ILLNESS:  Request for consultation greatly  appreciated concerning this 77 year old woman admitted with hip fracture  and reporting intermittent chest discomfort.  Renee Leblanc has enjoyed  generally excellent health.  She has no known cardiovascular disease and  other than hypertension has never been evaluated by cardiologist nor  undergone any significant cardiac testing.  She reports vague and poorly  characterized and mild chest discomfort that is diffuse over the  anterior chest.  There is no radiation.  There are no associated  symptoms.  The discomfort is typically brought on or exacerbated by  lying supine and resolves when she changes position in bed or rubs the  affected area.  There is no chest wall soreness.  There is no pleuritic  component.  She reports no exertional symptoms.   PAST MEDICAL HISTORY:  Notable for GERD, glaucoma, and hyperlipidemia.  She is under treatment for rheumatoid arthritis.   ALLERGIES:  The patient has no known drug allergies.   MEDICATIONS ON ADMISSION:  1. Methotrexate 2.5 mg q. week.  2. Folate 1 mg daily.  3. Benicar 40 mg daily.  4. Ziac 2.5/6.25 mg daily.  5. Nexium 40 mg daily.  6. Tylox 5/500 mg p.r.n.  7. Fosamax 70 mg q. week.  8. Demadex 20 mg daily p.r.n.  9. Lidoderm patches p.r.n.  10.Xalatan eye drops.  11.Timoptic eye drops.   SOCIAL HISTORY:  No use of alcohol; remote history of cigarette smoking  of less than 20 pack years.  Lives alone.   FAMILY HISTORY:  No prominent coronary disease.   REVIEW OF SYSTEMS:   Notable for a history of osteoarthritis,  hyperlipidemia, and glaucoma.   REMOTE OPERATIONS:  Tonsillectomy, appendectomy, and hysterectomy.  The  patient reports occasional and unpredictable episodes of dyspnea.  She  has chronic back pain.  She requires corrective lenses for near and far  vision.  She has partial dentures.  She has intermittent pedal edema.  All other systems reviewed and are negative.   PHYSICAL EXAMINATION:  GENERAL:  Pleasant woman lying immobile in bed in  no apparent pain when she is not moving.  VITAL SIGNS:  Blood pressure 140/80, heart rate 90 and regular,  respirations 20, O2 saturation 96% on room air.  HEENT:  Bilateral arcus; EOMs full; normal oral mucosa.  Decreased  hearing acuity.  NECK:  No jugular venous distension; normal carotid upstrokes without  bruits.  ENDOCRINE:  No thyromegaly.  HEMATOPOIETIC:  No adenopathy.  SKIN:  No significant lesions.  CARDIAC:  Normal first and second heart sounds; prominent fourth heart  sounds; normal PMI.  ABDOMEN:  Soft and nontender; normal bowel sounds; no  masses; no  organomegaly.  EXTREMITIES:  Trace edema; normal distal pulses.  NEUROMUSCULAR:  Symmetric strength and tone; normal cranial nerves.   EKG:  Normal sinus rhythm; nonspecific anterior T-wave abnormality;  nondiagnostic Q wave in lead III.  No prior tracing for comparison.   LABORATORY DATA:  Other laboratory notable for minimal hypokalemia with  potassium 3.4.  BUN and creatinine are normal.  CBC is normal.   IMPRESSION:  Renee Leblanc has very atypical symptoms of both vague chest  discomfort and intermittent dyspnea.  She has good exercise tolerance,  and generally benign history and no significant abnormalities on initial  laboratory testing.  Since surgical repair of her fracture is clearly  necessary and since further preoperative testing is unlikely to reduce  her risk of surgery which is primarily attributable to her advanced age,  I  would recommend proceeding with surgery forthwith.  We will be happy  to assist with her care perioperatively.      Gerrit Friends. Dietrich Pates, MD, [facility]  Electronically Signed     RMR/MEDQ  D:  01/20/2007  T:  01/20/2007  Job:  [id number]   cc:   Vickki Hearing, M.D.  Fax: [phone]

## 2011-01-14 NOTE — H&P (Signed)
NAME:  Renee Leblanc, Renee Leblanc              ACCOUNT NO.:  [id number]   MEDICAL RECORD NO.:  [id number]          PATIENT TYPE:  INP   LOCATION:  A340                          FACILITY:  [facility]   PHYSICIAN:  Vickki Hearing, M.D.DATE OF BIRTH:  [birth year]   DATE OF ADMISSION:  01/13/2007  DATE OF DISCHARGE:  LH                              HISTORY & PHYSICAL   CHIEF COMPLAINT:  Pain, right hip.   HISTORY:  This is an 77 year old female with history of osteoarthritis,  who happens to be on methotrexate, folic acid, Benicar, Ziac, Nexium,  Tylox, Fosamax, Demadex p.r.n., Lidoderm patches, Xalatan eye drops.   She presents with a history of hypertension, gastroesophageal reflux,  glaucoma, hyperlipidemia.  No history of rheumatoid arthritis.  Denies  any smoking or drinking, drug abuse.  Has NO DRUG ALLERGIES.  She  previously had a colonoscopy and snare polypectomy in 2005, by Dr.  Kendell Bane. Had fell and broke her hip.   She gives a history that she was being chased by dogs, fell onto her  right hip and could not get up and walk.  Some neighbors brought her  into the house, though could not stand on her leg and was brought to the  emergency room.  Radiographs show a 2-part intertrochanteric fracture of  the right fracture; minimal displacement, slight rotatory deformity.   REVIEW OF SYSTEMS:  She complains of pain in her right wrist,  intermittent swelling of the ankles, back pain, leg pain bilaterally,  some numbness and tingling in the legs as well.  Neurologic,  Psychiatric, Skin, Musculoskeletal, GI, Respiratory, Cardiovascular,  ENT, Constitutional symptoms are otherwise negative.   MEDICATIONS:  Are as stated.   CLINICAL EXAMINATION:  VITAL SIGNS:  Temperature 98.5, pulse 88,  respiratory rate 20, blood pressure 163/86, room air saturation 99%.  Height 65 inches.  Weight 59 kg.  APPEARANCE:  Normal.  She was awake and alert. She was oriented x3.  Mood and affect were normal.  HEENT:  She is hard of hearing.  Ears, nose and throat are otherwise  normal.  NECK:  Cervical spine nontender, no masses.  CHEST:  Clear.  CARDIAC:  Heart rate and rhythm normal.  ABDOMEN:  Soft, nondistended, nontender.  EXTREMITIES:  Upper extremities she has some scrapes over the right arm  and forearm.  No cuts though.  She does not have a rheumatoid-associated  drift, so I do not think she has rheumatoid arthritis.  She may have  some osteoarthritic changes in the hand, which are more likely.  Muscle  tone is normal.  Good grip strength is noted.  No atrophy.  No  contractures, subluxations, atrophy.  Tremors are noted in the upper  extremities.   She did not have edema or swelling in the legs today. The right leg is  in traction.  The left leg there is some flexion contraction noted in  the knee, crepitance in flexion; but, she has functional range of motion  (5-120).  No instability is noted.  No pain is noted on range of motion  of the hip or ankle.  Right hip is tender over the greater trochanter.  There is no bruising  there as yet.  NEUROVASCULAR EXAMINATION:  Extremities were intact.   DIAGNOSTIC TESTING:  The plain films show 2-part intratrochanteric  fracture, nondisplaced.  Slight rotation abnormality is noted.  There is  a bone spur at the inferior margin of the femoral head, indicating that  there was some osteoarthritic changes going on in the right hip.   The chest film was read out as no acute disease.  Lung hyperaeration.   LABORATORY RESULTS:  White count 8, hemoglobin 13, platelet count 243.  Sodium 142, potassium 3.4, chloride 105, CO2 30, glucose 111, BUN 14,  creatinine 0.72.  Liver function tests normal.  Total protein 6.3,  albumin 3.8, calcium 9.2.   EKG:  Pending.   IMPRESSION:  A 2-part intratrochanteric fracture.   PLAN:  Internal fixation of right hip, with a gamma nail.   I have discussed this with the patient, her son and daughter.   Reviewed  the potential complications, including nonunion, infection, screw cut  out, DVT, leg length discrepancy and need for cane or walker, pulmonary  embolus, deep vein thrombosis.   Plan for surgery on Thursday, Jan 14, 2007 (if OR time is available).      Vickki Hearing, M.D.  Electronically Signed     SEH/MEDQ  D:  01/13/2007  T:  01/13/2007  Job:  [id number]

## 2011-01-14 NOTE — Discharge Summary (Signed)
NAME:  Renee Leblanc, Renee Leblanc              ACCOUNT NO.:  [id number]   MEDICAL RECORD NO.:  [id number]          PATIENT TYPE:  INP   LOCATION:  A340                          FACILITY:  [facility]   PHYSICIAN:  Vickki Hearing, M.D.DATE OF BIRTH:  1917-03-17   DATE OF ADMISSION:  01/13/2007  DATE OF DISCHARGE:  05/22/2008LH                               DISCHARGE SUMMARY   ADMISSION DIAGNOSIS:  Fractured right hip.   DISCHARGE DIAGNOSIS:  Fractured right hip.   PROCEDURE:  May 16, the patient underwent short Gamma nail fixation with  Stryker Gamma nail 125 degree angle, 35 mm locking bolt, 95 lag screw  and proximal locking nut in sliding mode.  Surgery was done under spinal  with less than 150 mL blood loss.   OPERATIVE FINDINGS:  Two part intertrochanteric fracture with stable  posteromedial buttress.   HISTORY:  This 77 year old female was out near her mailbox and was  chased by dogs, fell and fractured her right hip.  She was brought into  the hospital and prepped for surgery.  On the 15th surgery was cancelled  because the patient complained of chest pain.  Medical and cardiac  consultations were performed.  Workup was negative.  She was placed on  telemetry and observed.  On the 16th she underwent surgery with no  complications, postoperatively did well, did complain of some shortness  of breath on the night of the 19th.  Second group of cardiac enzymes  were ordered.  They were also normal.  On Jan 19, 2007 she had a D-dimer  ordered and the results were 4.91 which are high and suggest pulmonary  embolus although the patient has been on Lovenox throughout the hospital  course even before surgery.  Her last chem-7 on the 19th sodium 139,  potassium 4.1, chloride 108, glucose 112, BUN and creatinine 10 and 0.6.  Hemoglobin 11 and platelet count 219.   DISCHARGE INSTRUCTIONS:  1. Take staples out on May 26.  2. Patient is full weightbearing as tolerated.  3. Follow-up in the office in  a month.   MED LIST:  1. Colace 100 mg twice daily.  2. Folic acid 1 mg daily.  3. Avapro 300 mg daily.  4. Xalatan eye ophthalmic solution o. u. at bedtime.  5. Milk of magnesia 30 mL daily p.r.n.  6. Skelaxin 800 mg by mouth every six hours as needed.  7. Methotrexate 2.5 mg by mouth on Mondays.  8. Lopressor 50 mg by mouth daily.  9. Protonix 80 mg by mouth daily w. c.  10.K-Dur 20 mEq twice daily.  11.Darvocet one every four hours as needed for pain.   The patient has a bed at the [facility] pending cardiac and medical  recommendations regarding the high level of d-dimer.  Clinically the  patient has no signs of a deep venous thrombosis.  I will leave that in  their capable hands to recommend follow-up and treatment regarding that.      Vickki Hearing, M.D.  Electronically Signed     SEH/MEDQ  D:  01/20/2007  T:  01/20/2007  Job:  [id number]

## 2011-01-17 ENCOUNTER — Ambulatory Visit: Payer: Medicare Other | Admitting: Gastroenterology

## 2011-01-17 ENCOUNTER — Encounter: Payer: Self-pay | Admitting: Urgent Care

## 2011-01-17 ENCOUNTER — Ambulatory Visit (INDEPENDENT_AMBULATORY_CARE_PROVIDER_SITE_OTHER): Payer: Medicare Other | Admitting: Urgent Care

## 2011-01-17 VITALS — BP 151/83 | HR 66 | Temp 97.6°F | Ht 65.0 in | Wt 118.4 lb

## 2011-01-17 DIAGNOSIS — R634 Abnormal weight loss: Secondary | ICD-10-CM

## 2011-01-17 LAB — COMPREHENSIVE METABOLIC PANEL
AST: 16 U/L (ref 0–37)
BUN: 22 mg/dL (ref 6–23)
Calcium: 10.3 mg/dL (ref 8.4–10.5)
Chloride: 102 mEq/L (ref 96–112)
Creat: 1.07 mg/dL (ref 0.40–1.20)
Total Bilirubin: 0.5 mg/dL (ref 0.3–1.2)

## 2011-01-17 LAB — CBC WITH DIFFERENTIAL/PLATELET
Basophils Absolute: 0 10*3/uL (ref 0.0–0.1)
Basophils Relative: 0 % (ref 0–1)
Eosinophils Absolute: 0.1 10*3/uL (ref 0.0–0.7)
Eosinophils Relative: 2 % (ref 0–5)
HCT: 40.3 % (ref 36.0–46.0)
MCH: 29.6 pg (ref 26.0–34.0)
MCHC: 32.5 g/dL (ref 30.0–36.0)
MCV: 91 fL (ref 78.0–100.0)
Monocytes Absolute: 0.7 10*3/uL (ref 0.1–1.0)
RDW: 13.6 % (ref 11.5–15.5)

## 2011-01-17 NOTE — Progress Notes (Signed)
Referring Provider: Colette Ribas, MD Primary Care Physician:  Colette Ribas, MD Primary Gastroenterologist:  Dr. Jena Gauss  Chief Complaint  Patient presents with  . Weight Loss    HPI:  Renee Leblanc is a 77 y.o. female here for follow up for weight loss.  Benign EGD & colonoscopy by Dr Jena Gauss 12/12/10.  Feels well.  Denies any GI concerns.  Only eating 2 very small, very healthy meals per day, sleeps late.  Very active social life, ie plays bridge, going out to eat, etc.  Does not snack during the day.  Dr Jena Gauss had given marinol to her at procedures but it does not appear that she is taking this.  Not taking boost, carnation, ensure as suggested.  Still drives her own vehicle.  Past Medical History  Diagnosis Date  . Chronic back pain     3 yrs  . Knee pain, right   . Pernicious anemia   . Overactive bladder   . Diverticulosis of colon 09/20/2003    colonoscopy Dr Jena Gauss , inflammatory polyps  . Hemorrhoids   . Acute angle-closure glaucoma   . Hypertension   . GERD (gastroesophageal reflux disease)   . Osteoporosis   . Tubular adenoma of colon 12/18/10    Last colonosocpy Dr Jena Gauss 2 TA removed, anal papillae, internal hemorrhoids  . Hiatal hernia 12/18/10    On last EGD Dr Elmer Ramp otherwise    Past Surgical History  Procedure Date  . Appendectomy   . Total abdominal hysterectomy   . Tonsillectomy   . Hip fracture surgery     Current Outpatient Prescriptions  Medication Sig Dispense Refill  . alendronate (FOSAMAX) 70 MG tablet Take 70 mg by mouth every 7 (seven) days. Take with a full glass of water on an empty stomach.       Marland Kitchen AmLODIPine Besylate (NORVASC PO) Take by mouth.        . Bisoprolol-Hydrochlorothiazide (ZIAC PO) Take by mouth.        . calcium carbonate 200 MG capsule Take 250 mg by mouth daily.        Marland Kitchen docusate sodium (COLACE) 100 MG capsule Take 100 mg by mouth daily.        . folic acid (FOLVITE) 1 MG tablet Take 1 mg by mouth daily.        .  furosemide (LASIX) 20 MG tablet Take 20 mg by mouth daily.        . irbesartan (AVAPRO) 300 MG tablet Take 300 mg by mouth at bedtime.        Marland Kitchen latanoprost (XALATAN) 0.005 % ophthalmic solution Place 1 drop into both eyes at bedtime.        . lidocaine (LIDODERM) 5 % Place 1 patch onto the skin daily. Remove & Discard patch within 12 hours or as directed by MD       . metoprolol (LOPRESSOR) 50 MG tablet Take 50 mg by mouth daily.        . nabumetone (RELAFEN) 500 MG tablet Take 500 mg by mouth 2 (two) times daily.        Marland Kitchen omeprazole (PRILOSEC) 20 MG capsule Take 20 mg by mouth daily.        Marland Kitchen oxyCODONE-acetaminophen (TYLOX) 5-500 MG per capsule Take 1 capsule by mouth every 4 (four) hours as needed.        . potassium chloride SA (K-DUR,KLOR-CON) 20 MEQ tablet Take 20 mEq by mouth daily.        Marland Kitchen  Timolol Maleate (TIMOPTIC OP) Apply to eye.        . Vitamin B1-B12 5.5-0.0125 MG/5ML SOLN Inject 1 Syringe as directed every 30 (thirty) days.          Allergies as of 01/17/2011  . (No Known Allergies)    Review of Systems: Gen: Denies any fever, chills, sweats, anorexia, fatigue, weakness, malaise, weight loss, and sleep disorder CV: Denies chest pain, angina, palpitations, syncope, orthopnea, PND, peripheral edema, and claudication. Resp: Denies dyspnea at rest, dyspnea with exercise, cough, sputum, wheezing, coughing up blood, and pleurisy. GI: Denies vomiting blood, jaundice, and fecal incontinence.   Denies dysphagia or odynophagia. Derm: Denies rash, itching, dry skin, hives, moles, warts, or unhealing ulcers.  Psych: Denies depression, anxiety, memory loss, suicidal ideation, hallucinations, paranoia, and confusion. Heme: Denies bruising, bleeding, and enlarged lymph nodes.  Physical Exam: BP 151/83  Pulse 66  Temp(Src) 97.6 F (36.4 C) (Temporal)  Ht 5\' 5"  (1.651 m)  Wt 118 lb 6.4 oz (53.706 kg)  BMI 19.70 kg/m2 General:   Alert,  Elderly female.  Well-developed, well-nourished,  pleasant and cooperative in NAD Head:  Normocephalic and atraumatic. Eyes:  Sclera clear, no icterus.   Conjunctiva pink. Mouth:  No deformity or lesions, dentition normal. Neck:  Supple; no masses or thyromegaly. Heart:  Regular rate and rhythm; no murmurs, clicks, rubs,  or gallops. Abdomen:  Soft, nontender and nondistended. No masses, hepatosplenomegaly or hernias noted. Normal bowel sounds, without guarding, and without rebound.   Msk:  Symmetrical without gross deformities. Normal posture. Pulses:  Normal pulses noted. Extremities:  Without clubbing or edema. Neurologic:  Alert and  oriented x4;  grossly normal neurologically. Skin:  Intact without significant lesions or rashes. Cervical Nodes:  No significant cervical adenopathy. Psych:  Alert and cooperative. Normal mood and affect.

## 2011-01-17 NOTE — Patient Instructions (Signed)
Eat snacks/meals every 2-3 hrs each day

## 2011-01-17 NOTE — Op Note (Signed)
NAME:  Renee Leblanc, Renee Leblanc                        ACCOUNT NO.:  [id number]   MEDICAL RECORD NO.:  [id number]                   PATIENT TYPE:  AMB   LOCATION:  DAY                                  FACILITY:  [facility]   PHYSICIAN:  R. Roetta Sessions, M.D.              DATE OF BIRTH:  August 23, 1917   DATE OF PROCEDURE:  09/20/2003  DATE OF DISCHARGE:                                 OPERATIVE REPORT   PROCEDURE:  Colonoscopy with snare polypectomy.   INDICATIONS FOR PROCEDURE:  The patient is an 77 year old lady who comes for  colorectal cancer screening.  She had a sigmoidoscopy back in 2000 without  any significant findings.  She is here for a full colonoscopy.  She has  never had her entire lower GI tract imaged.  She is devoid of any lower GI  tract symptoms.  There is no family history of colorectal neoplasia.  The  approach of colonoscopy was discussed with the patient at length.  The  potential risks, benefits, and alternatives have been reviewed and questions  answered.  She is agreeable.  Please see my handwritten H&P for more  information.   PROCEDURE:  O2 saturation, blood pressure, pulses, and respirations were  monitored throughout the entire procedure.  Conscious sedation was with  Versed 1 mg IV, Demerol 25 mg IV in divided doses.  The instrument used was  the Olympus video chip pediatric colonoscope.   FINDINGS:  Digital rectal examination revealed no abnormalities.   ENDOSCOPIC FINDINGS:  The prep was good.   Rectum:  Examination of the rectal mucosa including retroflex view of the  anal verge revealed internal hemorrhoids and anal papilla.  Otherwise  normal.   Colon:  The colonic mucosa was surveyed from the rectosigmoid junction  through the left, transverse, right colon to the area of the appendiceal  orifice, ileocecal valve, and cecum.  These structures were well-seen and  photographed for the record.  From this level, the scope was slowly  withdrawn.  All previously  mentioned mucosal surfaces were once again seen.  The patient was noted to have pancolonic diverticula and two 5-mm  pedunculated polyps at the hepatic flexure.  Both of these were cold snared  and recovered through the scope.  The remainder of the colonic mucosa  appeared normal.  The patient tolerated the procedure well and was reactive  in endoscopy.   IMPRESSION:  1. Prominent internal hemorrhoids and anal papilla.  Otherwise normal     rectum.  2. Pancolonic diverticula.  3. Two small pedunculated polyps at the hepatic flexure, cold snared and     recovered.   RECOMMENDATIONS:  1. No aspirin or arthritis medications for the next 10 days.  2. Diverticulosis literature provided to Ms. Garver.  3. Further recommendations to follow.      ___________________________________________  Jonathon Bellows, M.D.   RMR/MEDQ  D:  09/20/2003  T:  09/20/2003  Job:  [id number]   cc:   Patrica Duel, M.D.  [address]  [place]  [place] [address]  Fax: [phone]

## 2011-01-17 NOTE — Assessment & Plan Note (Signed)
Weight stable.  No further loss. Not taking marinol as directed.  Not supplementing diet with increased caloric intake.  Advanced age may be complicating picture, ? Early dementia due to sleeping later, etc.  She would like to complete wt loss work-up so will check thyroid & obtain CT Abd/pelvis with IV/oral contrast, along with CXR,   Pt instructed to eat every 2-3 hrs Supplement w/ Boost, carnation, ensure & icecream

## 2011-01-20 NOTE — Progress Notes (Signed)
Cc to PCP 

## 2011-01-23 ENCOUNTER — Ambulatory Visit (HOSPITAL_COMMUNITY)
Admission: RE | Admit: 2011-01-23 | Discharge: 2011-01-23 | Disposition: A | Discharge status: Home / Self Care | Payer: Medicare Other | Source: Ambulatory Visit | Attending: Urgent Care | Admitting: Urgent Care

## 2011-01-23 DIAGNOSIS — R933 Abnormal findings on diagnostic imaging of other parts of digestive tract: Secondary | ICD-10-CM | POA: Insufficient documentation

## 2011-01-23 DIAGNOSIS — I10 Essential (primary) hypertension: Secondary | ICD-10-CM | POA: Insufficient documentation

## 2011-01-23 DIAGNOSIS — J449 Chronic obstructive pulmonary disease, unspecified: Secondary | ICD-10-CM | POA: Insufficient documentation

## 2011-01-23 DIAGNOSIS — J4489 Other specified chronic obstructive pulmonary disease: Secondary | ICD-10-CM | POA: Insufficient documentation

## 2011-01-23 DIAGNOSIS — R634 Abnormal weight loss: Secondary | ICD-10-CM | POA: Insufficient documentation

## 2011-01-23 DIAGNOSIS — E279 Disorder of adrenal gland, unspecified: Secondary | ICD-10-CM | POA: Insufficient documentation

## 2011-01-23 MED ORDER — IOHEXOL 300 MG/ML  SOLN
100.0000 mL | Freq: Once | INTRAMUSCULAR | Status: AC | PRN
  Administered 2011-01-23: 100 mL via INTRAVENOUS

## 2011-01-24 NOTE — Progress Notes (Signed)
Cc to PCP 

## 2011-01-31 NOTE — Progress Notes (Signed)
agree

## 2011-02-04 ENCOUNTER — Encounter: Payer: Self-pay | Admitting: Internal Medicine

## 2011-04-11 ENCOUNTER — Encounter: Payer: Self-pay | Admitting: Internal Medicine

## 2011-05-09 ENCOUNTER — Ambulatory Visit: Payer: Medicare Other | Admitting: Internal Medicine

## 2011-06-09 ENCOUNTER — Ambulatory Visit (INDEPENDENT_AMBULATORY_CARE_PROVIDER_SITE_OTHER): Payer: Medicare Other | Admitting: Internal Medicine

## 2011-06-09 ENCOUNTER — Encounter: Payer: Self-pay | Admitting: Internal Medicine

## 2011-06-09 VITALS — BP 150/80 | HR 67 | Temp 97.6°F | Ht 65.0 in | Wt 115.6 lb

## 2011-06-09 DIAGNOSIS — R634 Abnormal weight loss: Secondary | ICD-10-CM

## 2011-06-09 NOTE — Assessment & Plan Note (Signed)
Insidiously progressive weight loss in the setting of a paucity of GI symptoms. Recent colonoscopy, CT scan and labs including thyroid studies as outlined above. Other than weight loss, I do not detect any alarm symptoms. She has significant urinary tract symptoms which is keeping her from getting a good nights sleep which warrant further evaluation.  I would like to see her take Marinol for the next one to 2 months to see if this makes a difference in her weight/oral intake.  Recommendations: Resume Marinol 0.5 mg twice daily. Office followup here in 6 weeks.  Please make an appoint to see Dr. Phillips Odor in reference to the above mentioned urinary symptoms

## 2011-06-09 NOTE — Patient Instructions (Signed)
Encourage good nutritional intake.  Resume Marinol 0.5 mg twice daily (already have this prescription)   Return visit here in 6 weeks.  See Dr. Phillips Odor in reference to frequent urinary symptoms

## 2011-06-09 NOTE — Progress Notes (Signed)
Primary Care Physician:  Colette Ribas, MD Primary Gastroenterologist:  Dr.   Pre-Procedure History & Physical: HPI:  Renee Leblanc is a 77 y.o. female here for followup of weight loss. She's lost 3 more pounds and she was seen earlier in the year process currently (115 pounds).  Chem-20, CBC and TSH all normal. CT scan compression fractures of the spine stable adrenal lesion. Really, nothing to explain weight loss. Patient denies absolutely any abdominal pain.  GI review of systems negative. Recent colonoscopy demonstrated colonic adenomatous-removed.  Patient states her appetite is well maintained. I gave her a prescription for some Marinol at the time of her colonoscopy previously. She took 3 tablets until she was taking too many medications and stopped this agent.  She feels her weight started coming off after her right hip fracture back in 2009.  She also complains of not sleeping well at all at night. She gets up to urinate every 2 hours throughout the night. Also is afraid to go to church because of the need to urinate frequently.  Past Medical History  Diagnosis Date  . Chronic back pain     3 yrs  . Knee pain, right   . Pernicious anemia   . Overactive bladder   . Diverticulosis of colon 09/20/2003    colonoscopy Dr Jena Gauss , inflammatory polyps  . Hemorrhoids   . Acute angle-closure glaucoma   . Hypertension   . GERD (gastroesophageal reflux disease)   . Osteoporosis   . Tubular adenoma of colon 12/18/10    Last colonosocpy Dr Jena Gauss 2 TA removed, anal papillae, internal hemorrhoids  . Hiatal hernia 12/18/10    On last EGD Dr Elmer Ramp otherwise    Past Surgical History  Procedure Date  . Appendectomy   . Total abdominal hysterectomy   . Tonsillectomy   . Hip fracture surgery     Prior to Admission medications   Medication Sig Start Date End Date Taking? Authorizing Provider  alendronate (FOSAMAX) 70 MG tablet Take 70 mg by mouth every 7 (seven) days. Take with a  full glass of water on an empty stomach.    Yes Historical Provider, MD  AmLODIPine Besylate (NORVASC PO) Take by mouth.     Yes Historical Provider, MD  Bisoprolol-Hydrochlorothiazide (ZIAC PO) Take by mouth.     Yes Historical Provider, MD  calcium carbonate 200 MG capsule Take 250 mg by mouth daily.     Yes Historical Provider, MD  docusate sodium (COLACE) 100 MG capsule Take 100 mg by mouth daily.     Yes Historical Provider, MD  folic acid (FOLVITE) 1 MG tablet Take 1 mg by mouth daily.     Yes Historical Provider, MD  furosemide (LASIX) 20 MG tablet Take 20 mg by mouth daily.     Yes Historical Provider, MD  irbesartan (AVAPRO) 300 MG tablet Take 300 mg by mouth at bedtime.     Yes Historical Provider, MD  latanoprost (XALATAN) 0.005 % ophthalmic solution Place 1 drop into both eyes at bedtime.     Yes Historical Provider, MD  lidocaine (LIDODERM) 5 % Place 1 patch onto the skin daily. Remove & Discard patch within 12 hours or as directed by MD  12/01/10  Yes Historical Provider, MD  metoprolol (LOPRESSOR) 50 MG tablet Take 50 mg by mouth daily.     Yes Historical Provider, MD  nabumetone (RELAFEN) 500 MG tablet Take 500 mg by mouth 2 (two) times daily.     Yes Historical  Provider, MD  omeprazole (PRILOSEC) 20 MG capsule Take 20 mg by mouth daily.     Yes Historical Provider, MD  oxyCODONE-acetaminophen (TYLOX) 5-500 MG per capsule Take 1 capsule by mouth every 4 (four) hours as needed.     Yes Historical Provider, MD  potassium chloride SA (K-DUR,KLOR-CON) 20 MEQ tablet Take 20 mEq by mouth daily.     Yes Historical Provider, MD  Timolol Maleate (TIMOPTIC OP) Apply to eye.     Yes Historical Provider, MD  Vitamin B1-B12 5.5-0.0125 MG/5ML SOLN Inject 1 Syringe as directed every 30 (thirty) days.     Yes Historical Provider, MD    Allergies as of 06/09/2011  . (No Known Allergies)    No family history on file.  History   Social History  . Marital Status: Widowed    Spouse Name: N/A     Number of Children: 1  . Years of Education: N/A   Occupational History  . retired    Social History Main Topics  . Smoking status: Never Smoker   . Smokeless tobacco: Never Used  . Alcohol Use: Yes     wine each night before dinner  . Drug Use: No  . Sexually Active: No   Other Topics Concern  . Not on file   Social History Narrative   Lives in [place] alone    Review of Systems: See HPI, otherwise negative ROS  Physical Exam: BP 150/80  Pulse 67  Temp(Src) 97.6 F (36.4 C) (Temporal)  Ht 5\' 5"  (1.651 m)  Wt 115 lb 9.6 oz (52.436 kg)  BMI 19.24 kg/m2 General:   Alert,  Well-developed, well-nourished, pleasant elderly lady - cooperative in NAD Head:  Normocephalic and atraumatic. Eyes:  Sclera clear, no icterus.   Conjunctiva pink. Ears:  Normal auditory acuity. Nose:  No deformity, discharge,  or lesions. Mouth:  No deformity or lesions, dentition normal. Neck:  Supple; no masses or thyromegaly. Lungs:  Clear throughout to auscultation.   No wheezes, crackles, or rhonchi. No acute distress. Heart:  Regular rate and rhythm; no murmurs, clicks, rubs,  or gallops. Abdomen:  Positive bowel sounds. Soft, nontender and nondistended. No masses, hepatosplenomegaly or hernias noted. Normal bowel sounds, without guarding, and without rebound.   Msk:  Symmetrical without gross deformities. Normal posture. Pulses:  Normal pulses noted. Extremities:  Without clubbing or edema.  Impression/Plan:

## 2011-08-01 ENCOUNTER — Ambulatory Visit: Payer: Medicare Other | Admitting: Internal Medicine

## 2011-08-01 ENCOUNTER — Telehealth: Payer: Self-pay | Admitting: Internal Medicine

## 2011-08-01 NOTE — Telephone Encounter (Signed)
Pt was a no show

## 2011-08-06 ENCOUNTER — Other Ambulatory Visit (HOSPITAL_COMMUNITY): Payer: Self-pay | Admitting: Family Medicine

## 2011-08-06 DIAGNOSIS — M169 Osteoarthritis of hip, unspecified: Secondary | ICD-10-CM

## 2011-08-07 ENCOUNTER — Ambulatory Visit (HOSPITAL_COMMUNITY)
Admission: RE | Admit: 2011-08-07 | Discharge: 2011-08-07 | Disposition: A | Discharge status: Home / Self Care | Payer: Medicare Other | Source: Ambulatory Visit | Attending: Family Medicine | Admitting: Family Medicine

## 2011-08-07 DIAGNOSIS — M169 Osteoarthritis of hip, unspecified: Secondary | ICD-10-CM | POA: Insufficient documentation

## 2011-08-07 DIAGNOSIS — M25559 Pain in unspecified hip: Secondary | ICD-10-CM | POA: Insufficient documentation

## 2011-08-07 DIAGNOSIS — M1610 Unilateral primary osteoarthritis, unspecified hip: Secondary | ICD-10-CM | POA: Insufficient documentation

## 2012-05-01 ENCOUNTER — Other Ambulatory Visit (HOSPITAL_COMMUNITY): Payer: Self-pay | Admitting: Family Medicine

## 2012-05-01 ENCOUNTER — Ambulatory Visit (HOSPITAL_COMMUNITY)
Admission: RE | Admit: 2012-05-01 | Discharge: 2012-05-01 | Disposition: A | Discharge status: Home / Self Care | Payer: Medicare Other | Source: Ambulatory Visit | Attending: Family Medicine | Admitting: Family Medicine

## 2012-05-01 DIAGNOSIS — X500XXA Overexertion from strenuous movement or load, initial encounter: Secondary | ICD-10-CM | POA: Insufficient documentation

## 2012-05-01 DIAGNOSIS — M25519 Pain in unspecified shoulder: Secondary | ICD-10-CM | POA: Insufficient documentation

## 2012-05-01 DIAGNOSIS — R52 Pain, unspecified: Secondary | ICD-10-CM

## 2012-05-01 DIAGNOSIS — T1490XA Injury, unspecified, initial encounter: Secondary | ICD-10-CM | POA: Insufficient documentation

## 2012-05-19 ENCOUNTER — Encounter: Payer: Self-pay | Admitting: Orthopedic Surgery

## 2012-05-19 ENCOUNTER — Ambulatory Visit (INDEPENDENT_AMBULATORY_CARE_PROVIDER_SITE_OTHER): Payer: Medicare Other | Admitting: Orthopedic Surgery

## 2012-05-19 VITALS — BP 160/90 | Ht 65.0 in | Wt 111.0 lb

## 2012-05-19 DIAGNOSIS — M12819 Other specific arthropathies, not elsewhere classified, unspecified shoulder: Secondary | ICD-10-CM | POA: Insufficient documentation

## 2012-05-19 DIAGNOSIS — M19019 Primary osteoarthritis, unspecified shoulder: Secondary | ICD-10-CM

## 2012-05-19 NOTE — Progress Notes (Signed)
  Subjective:    Patient ID: Renee Leblanc, female    DOB: 05-30-1917, 78 y.o.   MRN: [id number]  HPI Comments: Painful right shoulder with noted difficulty putting things in and out of the refrigerator  X-ray taken at the hospital shows a.c. joint arthrosis the humeral joint arthrosis and proximal humeral migration consistent with chronic rotator cuff tear disease  Shoulder Pain  The pain is present in the right shoulder. This is a new problem. The current episode started 1 to 4 weeks ago. There has been no history of extremity trauma. The problem occurs intermittently. The problem has been unchanged. The quality of the pain is described as aching. The pain is at a severity of 9/10. Associated symptoms include a limited range of motion and stiffness. Pertinent negatives include no itching, joint locking, joint swelling, numbness or tingling.      Review of Systems  Constitutional: Negative.   HENT: Negative.   Eyes: Negative.   Respiratory: Negative.   Cardiovascular: Negative.   Gastrointestinal: Positive for constipation.  Genitourinary: Positive for frequency.  Musculoskeletal: Positive for stiffness.  Skin: Negative for itching.  Neurological: Negative for tingling and numbness.  Hematological: Negative.        Objective:   Physical Exam  Vitals reviewed. Constitutional: She is oriented to person, place, and time. She appears well-developed and well-nourished.  Cardiovascular: Intact distal pulses.   Lymphadenopathy:       Right cervical: No superficial cervical adenopathy present.      Left cervical: No superficial cervical adenopathy present.  Neurological: She is alert and oriented to person, place, and time. She has normal reflexes.  Skin: Skin is warm and dry.  Psychiatric: She has a normal mood and affect. Her behavior is normal. Judgment and thought content normal.  Right Shoulder Exam   Tenderness  The patient is experiencing tenderness in the  acromioclavicular joint (Anterior joint line).  Range of Motion  Active Abduction: abnormal  Passive Abduction: abnormal  Extension: abnormal  Forward Flexion: abnormal  External Rotation: abnormal   Muscle Strength  Abduction: 4/5  Internal Rotation: 5/5  External Rotation: 4/5  Supraspinatus: 2/5  Subscapularis: 5/5  Biceps: 5/5   Tests  Apprehension: positive Drop Arm: positive Hawkin's test: positive Impingement: positive Sulcus: absent  Other  Erythema: absent Scars: absent Sensation: normal Pulse: present   Left Shoulder Exam  Left shoulder exam is normal.      X-ray from the hospital shows chronic rotator cuff arthropathy       Assessment & Plan:  Chronic rotator cuff tear secondary arthritic changes  Patient does not want to have surgery  Patient given injection  Patient declined physical therapy  Shoulder Injection Procedure Note   Pre-operative Diagnosis: right  RC Syndrome  Post-operative Diagnosis: same  Indications: pain   Anesthesia: ethyl chloride   Procedure Details   Verbal consent was obtained for the procedure. The shoulder was prepped withalcohol and the skin was anesthetized. A 20 gauge needle was advanced into the subacromial space through posterior approach without difficulty  The space was then injected with 3 ml 1% lidocaine and 1 ml of depomedrol. The injection site was cleansed with isopropyl alcohol and a dressing was applied.  Complications:  None; patient tolerated the procedure well.

## 2012-05-19 NOTE — Patient Instructions (Addendum)
You have received a steroid shot. 15% of patients experience increased pain at the injection site with in the next 24 hours. This is best treated with ice and tylenol extra strength 2 tabs every 8 hours. If you are still having pain please call the office.   Rotator Cuff Tear The rotator cuff is four tendons that assist in the motion of the shoulder. A rotator cuff tear is a tear in one of these four tendons. It is characterized by pain and weakness of the shoulder. The rotator cuff tendons surround the shoulder ball and socket joint (humeral head). The rotator cuff tendons attach to the shoulder blade (scapula) on one side and the upper arm bone (humerus) on the other side. The rotator cuff is essential for shoulder stability and shoulder motion. SYMPTOMS    Pain around the shoulder, often at the outer portion of the upper arm.   Pain that is worse with shoulder function, especially when reaching overhead or lifting.   Weakness of the shoulder muscles.   Aching when not using your arm; often, pain awakens you at night, especially when sleeping on the affected side.   Tenderness, swelling, warmth, or redness over the outer aspect of the shoulder.   Loss of strength.   Limited motion of the shoulder, especially reaching behind (reaching into one's back pocket) or across your body.   A crackling sound (crepitation) when moving the shoulder.   Biceps tendon pain (in the front of the shoulder) and inflammation, worse with bending the elbow or lifting.  CAUSES    Strain from sudden increase in amount or intensity of activity.   Direct blow or injury to the shoulder.   Aging, wear from from normal use.   Roof of the shoulder (acromial) spur.  RISK INCREASES WITH:    Contact sports (football, wrestling, or boxing).   Throwing or hitting sports (baseball, tennis, or volleyball).   Weightlifting and bodybuilding.   Heavy labor.   Previous injury to rotator cuff.   Failure to warm  up properly before activity.   Inadequate protective equipment.   Increasing age.   Spurring of the outer end of the scapula (acromion).   Cortisone injections.   Poor shoulder strength and flexibility.  PREVENTION  Warm up and stretch properly before activity.   Allow time for rest and recovery between practices and competition.   Maintain physical fitness:   Cardiovascular fitness.   Shoulder flexibility.   Strength and endurance of the rotator cuff muscles and muscles of the shoulder blade.   Learn and use proper technique when throwing or hitting.  PROGNOSIS Surgery is often needed. Although, symptoms may go away by themselves. RELATED COMPLICATIONS    Persistent pain that may progress to constant pain.   Shoulder stiffness, frozen shoulder syndrome, or loss of motion.   Recurrence of symptoms, especially if treated without surgery.   Inability to return to same level of sports, even with surgery.   Persistent weakness.   Risks of surgery, including infection, bleeding, injury to nerves, shoulder stiffness, weakness, re-tearing of the rotator cuff tendon.   Deltoid detachment, acromial fracture, and persistent pain.  TREATMENT Treatment involves the use of ice and medicine to reduce pain and inflammation. Strengthening and stretching exercise are usually recommended. These exercises may be completed at home or with a therapist. You may also be instructed to modify offending activities. Corticosteroid injections may be given to reduce inflammation. Surgery is usually recommended for athletes. Surgery has the best chance  for a full recovery. Surgery involves:  Removal of an inflamed bursa.   Removal of an acromial spur if present.   Suturing the torn tendon back together.  Rotator cuff surgeries may be preformed either arthroscopically or through an open incision. Recovery typically takes 6 to 12 months. MEDICATION  If pain medicine is necessary, then  nonsteroidal anti-inflammatory medicines, such as aspirin and ibuprofen, or other minor pain relievers, such as acetaminophen, are often recommended.   Do not take pain medicine for 7 days before surgery.   Prescription pain relievers are usually only prescribed after surgery. Use only as directed and only as much as you need.   Corticosteroid injections may be given to reduce inflammation. However, there is a limited number of times the joint may be injected with these medicines.  HEAT AND COLD  Cold treatment (icing) relieves pain and reduces inflammation. Cold treatment should be applied for 10 to 15 minutes every 2 to 3 hours for inflammation and pain and immediately after any activity that aggravates your symptoms. Use ice packs or massage the area with a piece of ice (ice massage).   Heat treatment may be used prior to performing the stretching and strengthening activities prescribed by your caregiver, physical therapist, or athletic trainer. Use a heat pack or soak the injury in warm water.

## 2012-05-24 ENCOUNTER — Telehealth: Payer: Self-pay | Admitting: Radiology

## 2012-05-24 NOTE — Telephone Encounter (Signed)
Patient said you told her to call if shot did not help her shoulder. I told her she could not get another shot this soon. I asked if she was willing to go to therapy and she said no. Please advise.

## 2012-05-24 NOTE — Telephone Encounter (Signed)
Too soon   Wait 2 weeks and call us back

## 2012-05-27 NOTE — Telephone Encounter (Signed)
Patient called back and Steward Drone advised her of what Dr. Romeo Apple recommended.

## 2012-06-14 ENCOUNTER — Telehealth: Payer: Self-pay | Admitting: Orthopedic Surgery

## 2012-06-14 NOTE — Telephone Encounter (Signed)
As discussed   The injection probably wouldn't wotk  Surgery is not an option and she is on Percocet already,   My recommendation is therapy

## 2012-06-14 NOTE — Telephone Encounter (Signed)
Renee Leblanc said since she had the shoulder injection she has had very little relief.  She is asking if she needs to schedule another appointment  2.  She also wanted you to know that she is having right hip pain.  You did surgery in 2009.  I told her we would need to schedule an appointment for the hip, but she wanted you to know this first. Her # [phone]

## 2012-06-14 NOTE — Telephone Encounter (Signed)
No answer, no machine, will try later

## 2012-06-15 NOTE — Telephone Encounter (Signed)
No note, error

## 2012-06-15 NOTE — Telephone Encounter (Signed)
Spoke with patient , she said she will think about the therapy for her shoulder and let us know later

## 2012-06-24 ENCOUNTER — Ambulatory Visit (INDEPENDENT_AMBULATORY_CARE_PROVIDER_SITE_OTHER): Payer: Medicare Other | Admitting: Orthopedic Surgery

## 2012-06-24 ENCOUNTER — Encounter: Payer: Self-pay | Admitting: Orthopedic Surgery

## 2012-06-24 VITALS — BP 120/82 | Ht 65.0 in | Wt 111.0 lb

## 2012-06-24 DIAGNOSIS — M5137 Other intervertebral disc degeneration, lumbosacral region: Secondary | ICD-10-CM

## 2012-06-24 DIAGNOSIS — M5430 Sciatica, unspecified side: Secondary | ICD-10-CM

## 2012-06-24 DIAGNOSIS — M5136 Other intervertebral disc degeneration, lumbar region: Secondary | ICD-10-CM

## 2012-06-24 DIAGNOSIS — M19019 Primary osteoarthritis, unspecified shoulder: Secondary | ICD-10-CM

## 2012-06-24 DIAGNOSIS — M12819 Other specific arthropathies, not elsewhere classified, unspecified shoulder: Secondary | ICD-10-CM

## 2012-06-24 NOTE — Patient Instructions (Signed)
You have received a steroid shot. 15% of patients experience increased pain at the injection site with in the next 24 hours. This is best treated with ice and tylenol extra strength 2 tabs every 8 hours. If you are still having pain please call the office.   Call us if you want in house therapy

## 2012-06-24 NOTE — Progress Notes (Signed)
Patient ID: Renee Leblanc, female   DOB: [birth year], 78 y.o.   MRN: [id number] Chief Complaint  Patient presents with  . Hip Pain    right hip pain, OTIF 01/15/07   1. Rotator cuff arthropathy   2. Sciatica     Chief complaint RIGHT hip pain.  Status post gamma nail, RIGHT hip. Her RIGHT hip fracture.  History degenerative disc disease with degenerative scoliosis.  Complains of pain in the RIGHT gluteal area, RIGHT iliac crest, and radiating occasionally down the RIGHT leg.  She still has  not improved from a rotator cuff arthropathy.  BP 120/82  Ht 5\' 5"  (1.651 m)  Wt 111 lb (50.349 kg)  BMI 18.47 kg/m2 Physical Exam(6) GENERAL: normal development   CDV: pulses are normal   Skin: normal  Psychiatric: awake, alert and oriented  Neuro: normal sensation  MSK gait disturbance  1 pain tenderness over right hip crest and gluteal area  2 ROM hip normal  3 MMT 5 right leg    Assessment: sciatica, DDD, scoliosis     Plan: IM inject right hip   F/u prn    However she's not had any physical therapy. Pain medication doesn't seem to help including oxycodone.  At this point would like to try an injection in the hip just as a last resort. She's not a surgical candidate.  We will try to see if she can get home therapy for the shoulder, but she drives and is not one homebound, but she does not walk well. She has difficulty walking, even 10 feet with a cane or walker.  She cannot travel, very well because it is difficult for her to get in and out of the car.

## 2012-09-02 ENCOUNTER — Ambulatory Visit (INDEPENDENT_AMBULATORY_CARE_PROVIDER_SITE_OTHER): Payer: Medicare Other

## 2012-09-02 ENCOUNTER — Ambulatory Visit (INDEPENDENT_AMBULATORY_CARE_PROVIDER_SITE_OTHER): Payer: Medicare Other | Admitting: Orthopedic Surgery

## 2012-09-02 ENCOUNTER — Encounter: Payer: Self-pay | Admitting: Orthopedic Surgery

## 2012-09-02 VITALS — BP 130/86 | Ht 65.0 in | Wt 111.0 lb

## 2012-09-02 DIAGNOSIS — M5410 Radiculopathy, site unspecified: Secondary | ICD-10-CM

## 2012-09-02 DIAGNOSIS — IMO0002 Reserved for concepts with insufficient information to code with codable children: Secondary | ICD-10-CM

## 2012-09-02 MED ORDER — GABAPENTIN 100 MG PO CAPS: 100.0000 mg | ORAL_CAPSULE | Freq: Three times a day (TID) | ORAL | Status: AC

## 2012-09-02 NOTE — Progress Notes (Signed)
Patient ID: Renee Leblanc, female   DOB: 27-Oct-1916, 79 y.o.   MRN: [id number] Chief Complaint  Patient presents with  . Hip Pain    right hip pain that started after a fall about 2 weeks ago    Renee Leblanc fell about 2 weeks ago complains of burning pain in her right leg she status post gamma nail over 5 years ago she has horrible degenerative disc disease with incredible scoliosis. She's a mature with a cane. She said her right leg sort of gave way on her and she fell when she was getting in the car after going to the beauty [employer]  She has no red flags at this time.  She has chronic back pain chronic pain throughout her joints  BP 130/86  Ht 5\' 5"  (1.651 m)  Wt 111 lb (50.349 kg)  BMI 18.47 kg/m2 Physical Exam(12)  Vital signs:   GENERAL: normal development   CDV: pulses are normal   Skin: normal  Lymph: nodes were not palpable/normal  Psychiatric: awake, alert and oriented  Neuro: normal sensation  MSK  Gait: She is ambulating with a cane in her right hand and she struggles. She has tenderness in her lower back scoliosis kyphosis normal range of motion in both hips knees and ankles motor exam normal all joints stable  X-ray show degenerative scoliosis and degenerative disc disease  Impression radiculopathy posttraumatic  Recommend run under milligrams 3 times a day Until symptoms resolve

## 2012-09-02 NOTE — Patient Instructions (Addendum)
Radicular pain Degenerative Disk Disease Degenerative disk disease is a condition caused by the changes that occur in the cushions of the backbone (spinal disks) as you grow older. Spinal disks are soft and compressible disks located between the bones of the spine (vertebrae). They act like shock absorbers. Degenerative disk disease can affect the whole spine. However, the neck and lower back are most commonly affected. Many changes can occur in the spinal disks with aging, such as:  The spinal disks may dry and shrink.   Small tears may occur in the tough, outer covering of the disk (annulus).   The disk space may become smaller due to loss of water.   Abnormal growths in the bone (spurs) may occur. This can put pressure on the nerve roots exiting the spinal canal, causing pain.   The spinal canal may become narrowed.  CAUSES   Degenerative disk disease is a condition caused by the changes that occur in the spinal disks with aging. The exact cause is not known, but there is a genetic basis for many patients. Degenerative changes can occur due to loss of fluid in the disk. This makes the disk thinner and reduces the space between the backbones. Small cracks can develop in the outer layer of the disk. This can lead to the breakdown of the disk. You are more likely to get degenerative disk disease if you are overweight. Smoking cigarettes and doing heavy work such as weightlifting can also increase your risk of this condition. Degenerative changes can start after a sudden injury. Growth of bone spurs can compress the nerve roots and cause pain.   SYMPTOMS   The symptoms vary from person to person. Some people may have no pain, while others have severe pain. The pain may be so severe that it can limit your activities. The location of the pain depends on the part of your backbone that is affected. You will have neck or arm pain if a disk in the neck area is affected. You will have pain in your back,  buttocks, or legs if a disk in the lower back is affected. The pain becomes worse while bending, reaching up, or with twisting movements. The pain may start gradually and then get worse as time passes. It may also start after a major or minor injury. You may feel numbness or tingling in the arms or legs.   DIAGNOSIS   Your caregiver will ask you about your symptoms and about activities or habits that may cause the pain. He or she may also ask about any injuries, diseases, or treatments you have had earlier. Your caregiver will examine you to check for the range of movement that is possible in the affected area, to check for strength in your extremities, and to check for sensation in the areas of the arms and legs supplied by different nerve roots. An X-ray of the spine may be taken. Your caregiver may suggest other imaging tests, such as magnetic resonance imaging (MRI), if needed.   TREATMENT   Treatment includes rest, modifying your activities, and applying ice and heat. Your caregiver may prescribe medicines to reduce your pain and may ask you to do some exercises to strengthen your back. In some cases, you may need surgery. You and your caregiver will decide on the treatment that is best for you. HOME CARE INSTRUCTIONS    Follow proper lifting and walking techniques as advised by your caregiver.   Maintain good posture.   Exercise regularly as  advised.   Perform relaxation exercises.   Change your sitting, standing, and sleeping habits as advised. Change positions frequently.   Lose weight as advised.   Stop smoking if you smoke.   Wear supportive footwear.  SEEK MEDICAL CARE IF:   Your pain does not go away within 1 to 4 weeks. SEEK IMMEDIATE MEDICAL CARE IF:    Your pain is severe.   You notice weakness in your arms, hands, or legs.   You begin to lose control of your bladder or bowel movements.  MAKE SURE YOU:    Understand these instructions.   Will watch your condition.     Will get help right away if you are not doing well or get worse.  Document Released: 06/15/2007 Document Revised: 11/10/2011 Document Reviewed: 06/15/2007 [facility] Patient Information 2013 [place], [place].   Radicular Pain Radicular pain in either the arm or leg is usually from a bulging or herniated disk in the spine. A piece of the herniated disk may press against the nerves as the nerves exit the spine. This causes pain which is felt at the tips of the nerves down the arm or leg. Other causes of radicular pain may include:  Fractures.   Heart disease.   Cancer.   An abnormal and usually degenerative state of the nervous system or nerves (neuropathy).  Diagnosis may require CT or MRI scanning to determine the primary cause.  Nerves that start at the neck (nerve roots) may cause radicular pain in the outer shoulder and arm. It can spread down to the thumb and fingers. The symptoms vary depending on which nerve root has been affected. In most cases radicular pain improves with conservative treatment. Neck problems may require physical therapy, a neck collar, or cervical traction. Treatment may take many weeks, and surgery may be considered if the symptoms do not improve.   Conservative treatment is also recommended for sciatica. Sciatica causes pain to radiate from the lower back or buttock area down the leg into the foot. Often there is a history of back problems. Most patients with sciatica are better after 2 to 4 weeks of rest and other supportive care. Short term bed rest can reduce the disk pressure considerably. Sitting, however, is not a good position since this increases the pressure on the disk. You should avoid bending, lifting, and all other activities which make the problem worse. Traction can be used in severe cases. Surgery is usually reserved for patients who do not improve within the first months of treatment. Only take over-the-counter or prescription medicines for pain,  discomfort, or fever as directed by your caregiver. Narcotics and muscle relaxants may help by relieving more severe pain and spasm and by providing mild sedation. Cold or massage can give significant relief. Spinal manipulation is not recommended. It can increase the degree of disc protrusion. Epidural steroid injections are often effective treatment for radicular pain. These injections deliver medicine to the spinal nerve in the space between the protective covering of the spinal cord and back bones (vertebrae). Your caregiver can give you more information about steroid injections. These injections are most effective when given within two weeks of the onset of pain.   You should see your caregiver for follow up care as recommended. A program for neck and back injury rehabilitation with stretching and strengthening exercises is an important part of management.   SEEK IMMEDIATE MEDICAL CARE IF:  You develop increased pain, weakness, or numbness in your arm or leg.  You develop difficulty with bladder or bowel control.   You develop abdominal pain.  Document Released: 09/25/2004 Document Revised: 11/10/2011 Document Reviewed: 12/11/2008 [facility] Patient Information 2013 [place], [place].   Sciatica Sciatica is a condition often seen in patients with disk disease of the lower back. Pressure on the sciatica nerve causes pain to radiate from the lower back or buttock down the leg.   CAUSES   It results from pressure on nerve roots coming out of the spine. This is often the result of a disc that deteriorates and pushes to one side. Often there is a history of back problems.   TREATMENT   In most cases sciatica improves greatly with conservative treatment (treatment that does not involve surgery). Most patients are completely better after 2-4 weeks of bed rest and other supportive care. Bed rest reduces the disc pressure greatly. Sitting is the worst position. When sitting pressure on the disc is over 5  times greater than it is while lying down. Avoid:  Bending.   Lifting.   All other activities which make the problem worse.  After the pain improves, you may continue with normal activity. Take brief periods for bed rest throughout the day until you are back to normal. Only take over-the-counter or prescription medicines for pain, discomfort, or fever as directed by your caregiver. Muscle relaxants may help by relieving spasm and providing mild sedation. Cold or heat therapy and massage may also give significant relief. Spinal manipulation is not recommended because it can increase the degree of disc protrusion. Traction can be used in severe cases. Surgery is reserved for patients who:  Do not improve within the first months of conservative treatment.   Have signs of severe nerve root pressure.  See your doctor for follow up care as recommended. A program for back injury rehabilitation with stretching and strengthening exercises is an important part of healing.   SEEK MEDICAL CARE IF:    You notice increased pain.   You notice weakness.   You have numbness in your legs.   You have any difficulty with bladder or bowel control.  Document Released: 09/25/2004 Document Revised: 08/07/2011 Document Reviewed: 08/18/2005 [facility] Patient Information 2012 [place], [place].

## 2013-02-03 ENCOUNTER — Other Ambulatory Visit: Payer: Self-pay | Admitting: *Deleted

## 2013-02-03 MED ORDER — METOPROLOL TARTRATE 50 MG PO TABS: 25.0000 mg | ORAL_TABLET | Freq: Two times a day (BID) | ORAL | Status: DC

## 2013-03-01 ENCOUNTER — Ambulatory Visit: Payer: Self-pay | Admitting: Internal Medicine

## 2013-03-13 ENCOUNTER — Emergency Department (HOSPITAL_COMMUNITY): Payer: Medicare Other

## 2013-03-13 ENCOUNTER — Encounter (HOSPITAL_COMMUNITY): Payer: Self-pay

## 2013-03-13 ENCOUNTER — Inpatient Hospital Stay (HOSPITAL_COMMUNITY)
Admission: EM | Admit: 2013-03-13 | Discharge: 2013-03-17 | DRG: 064 | Disposition: A | Discharge status: Skilled Nursing Facility | Payer: Medicare Other | Attending: Internal Medicine | Admitting: Internal Medicine

## 2013-03-13 DIAGNOSIS — Z96649 Presence of unspecified artificial hip joint: Secondary | ICD-10-CM

## 2013-03-13 DIAGNOSIS — E876 Hypokalemia: Secondary | ICD-10-CM | POA: Diagnosis present

## 2013-03-13 DIAGNOSIS — I639 Cerebral infarction, unspecified: Secondary | ICD-10-CM

## 2013-03-13 DIAGNOSIS — I4892 Unspecified atrial flutter: Secondary | ICD-10-CM | POA: Diagnosis present

## 2013-03-13 DIAGNOSIS — M47817 Spondylosis without myelopathy or radiculopathy, lumbosacral region: Secondary | ICD-10-CM | POA: Diagnosis present

## 2013-03-13 DIAGNOSIS — I69319 Unspecified symptoms and signs involving cognitive functions following cerebral infarction: Secondary | ICD-10-CM

## 2013-03-13 DIAGNOSIS — S8000XA Contusion of unspecified knee, initial encounter: Secondary | ICD-10-CM | POA: Diagnosis present

## 2013-03-13 DIAGNOSIS — F05 Delirium due to known physiological condition: Secondary | ICD-10-CM

## 2013-03-13 DIAGNOSIS — E860 Dehydration: Secondary | ICD-10-CM

## 2013-03-13 DIAGNOSIS — N179 Acute kidney failure, unspecified: Secondary | ICD-10-CM

## 2013-03-13 DIAGNOSIS — M51379 Other intervertebral disc degeneration, lumbosacral region without mention of lumbar back pain or lower extremity pain: Secondary | ICD-10-CM | POA: Diagnosis present

## 2013-03-13 DIAGNOSIS — N390 Urinary tract infection, site not specified: Secondary | ICD-10-CM

## 2013-03-13 DIAGNOSIS — M5137 Other intervertebral disc degeneration, lumbosacral region: Secondary | ICD-10-CM | POA: Diagnosis present

## 2013-03-13 DIAGNOSIS — M1710 Unilateral primary osteoarthritis, unspecified knee: Secondary | ICD-10-CM

## 2013-03-13 DIAGNOSIS — Y92009 Unspecified place in unspecified non-institutional (private) residence as the place of occurrence of the external cause: Secondary | ICD-10-CM

## 2013-03-13 DIAGNOSIS — M5430 Sciatica, unspecified side: Secondary | ICD-10-CM | POA: Diagnosis present

## 2013-03-13 DIAGNOSIS — I10 Essential (primary) hypertension: Secondary | ICD-10-CM

## 2013-03-13 DIAGNOSIS — Z90710 Acquired absence of both cervix and uterus: Secondary | ICD-10-CM

## 2013-03-13 DIAGNOSIS — R748 Abnormal levels of other serum enzymes: Secondary | ICD-10-CM | POA: Diagnosis present

## 2013-03-13 DIAGNOSIS — R4182 Altered mental status, unspecified: Secondary | ICD-10-CM

## 2013-03-13 DIAGNOSIS — M5410 Radiculopathy, site unspecified: Secondary | ICD-10-CM

## 2013-03-13 DIAGNOSIS — G9340 Encephalopathy, unspecified: Secondary | ICD-10-CM | POA: Diagnosis present

## 2013-03-13 DIAGNOSIS — I6350 Cerebral infarction due to unspecified occlusion or stenosis of unspecified cerebral artery: Principal | ICD-10-CM | POA: Diagnosis present

## 2013-03-13 DIAGNOSIS — IMO0002 Reserved for concepts with insufficient information to code with codable children: Secondary | ICD-10-CM

## 2013-03-13 DIAGNOSIS — R7989 Other specified abnormal findings of blood chemistry: Secondary | ICD-10-CM

## 2013-03-13 DIAGNOSIS — H40219 Acute angle-closure glaucoma, unspecified eye: Secondary | ICD-10-CM | POA: Diagnosis present

## 2013-03-13 DIAGNOSIS — G8929 Other chronic pain: Secondary | ICD-10-CM | POA: Diagnosis present

## 2013-03-13 DIAGNOSIS — M810 Age-related osteoporosis without current pathological fracture: Secondary | ICD-10-CM | POA: Diagnosis present

## 2013-03-13 DIAGNOSIS — M4120 Other idiopathic scoliosis, site unspecified: Secondary | ICD-10-CM | POA: Diagnosis present

## 2013-03-13 DIAGNOSIS — M51369 Other intervertebral disc degeneration, lumbar region without mention of lumbar back pain or lower extremity pain: Secondary | ICD-10-CM

## 2013-03-13 DIAGNOSIS — Z79899 Other long term (current) drug therapy: Secondary | ICD-10-CM

## 2013-03-13 DIAGNOSIS — W19XXXA Unspecified fall, initial encounter: Secondary | ICD-10-CM

## 2013-03-13 DIAGNOSIS — M5136 Other intervertebral disc degeneration, lumbar region: Secondary | ICD-10-CM

## 2013-03-13 DIAGNOSIS — I4891 Unspecified atrial fibrillation: Secondary | ICD-10-CM

## 2013-03-13 DIAGNOSIS — M6282 Rhabdomyolysis: Secondary | ICD-10-CM

## 2013-03-13 DIAGNOSIS — K219 Gastro-esophageal reflux disease without esophagitis: Secondary | ICD-10-CM | POA: Diagnosis present

## 2013-03-13 DIAGNOSIS — R634 Abnormal weight loss: Secondary | ICD-10-CM

## 2013-03-13 DIAGNOSIS — M549 Dorsalgia, unspecified: Secondary | ICD-10-CM

## 2013-03-13 DIAGNOSIS — N318 Other neuromuscular dysfunction of bladder: Secondary | ICD-10-CM | POA: Diagnosis present

## 2013-03-13 HISTORY — DX: Essential (primary) hypertension: I10

## 2013-03-13 HISTORY — DX: Unspecified atrial fibrillation: I48.91

## 2013-03-13 HISTORY — DX: Cerebral infarction, unspecified: I63.9

## 2013-03-13 HISTORY — DX: Unspecified symptoms and signs involving cognitive functions following cerebral infarction: I69.319

## 2013-03-13 LAB — CBC WITH DIFFERENTIAL/PLATELET
Basophils Absolute: 0 10*3/uL (ref 0.0–0.1)
Basophils Relative: 0 % (ref 0–1)
Eosinophils Absolute: 0 10*3/uL (ref 0.0–0.7)
Eosinophils Relative: 0 % (ref 0–5)
MCH: 30.1 pg (ref 26.0–34.0)
MCHC: 33.6 g/dL (ref 30.0–36.0)
MCV: 89.6 fL (ref 78.0–100.0)
Platelets: 174 10*3/uL (ref 150–400)
RDW: 14 % (ref 11.5–15.5)

## 2013-03-13 LAB — COMPREHENSIVE METABOLIC PANEL
AST: 46 U/L — ABNORMAL HIGH (ref 0–37)
Albumin: 4 g/dL (ref 3.5–5.2)
Alkaline Phosphatase: 92 U/L (ref 39–117)
CO2: 30 mEq/L (ref 19–32)
Chloride: 101 mEq/L (ref 96–112)
Potassium: 3.5 mEq/L (ref 3.5–5.1)
Total Bilirubin: 1.2 mg/dL (ref 0.3–1.2)

## 2013-03-13 LAB — URINALYSIS, ROUTINE W REFLEX MICROSCOPIC
Bilirubin Urine: NEGATIVE
Nitrite: NEGATIVE
Protein, ur: >300 mg/dL — AB
Urobilinogen, UA: 0.2 mg/dL (ref 0.0–1.0)
pH: 6.5 (ref 5.0–8.0)

## 2013-03-13 LAB — TROPONIN I: Troponin I: <0.3 ng/mL (ref <0.30)

## 2013-03-13 LAB — URINE MICROSCOPIC-ADD ON

## 2013-03-13 MED ORDER — LATANOPROST 0.005 % OP SOLN
OPHTHALMIC | Status: AC
  Filled 2013-03-13: qty 2.5

## 2013-03-13 MED ORDER — SODIUM CHLORIDE 0.9 % IV BOLUS (SEPSIS)
500.0000 mL | Freq: Once | INTRAVENOUS | Status: AC
  Administered 2013-03-13: 19:00:00 via INTRAVENOUS

## 2013-03-13 MED ORDER — ENOXAPARIN SODIUM 30 MG/0.3ML ~~LOC~~ SOLN
30.0000 mg | SUBCUTANEOUS | Status: DC
  Administered 2013-03-13: 30 mg via SUBCUTANEOUS
  Filled 2013-03-13: qty 0.3

## 2013-03-13 MED ORDER — ACETAMINOPHEN 325 MG PO TABS
650.0000 mg | ORAL_TABLET | ORAL | Status: DC | PRN
  Administered 2013-03-16 (×2): 650 mg via ORAL
  Filled 2013-03-13 (×3): qty 2

## 2013-03-13 MED ORDER — METOPROLOL TARTRATE 25 MG PO TABS
25.0000 mg | ORAL_TABLET | Freq: Two times a day (BID) | ORAL | Status: DC
  Administered 2013-03-13 – 2013-03-14 (×3): 25 mg via ORAL
  Filled 2013-03-13 (×3): qty 1

## 2013-03-13 MED ORDER — LIDOCAINE 5 % EX PTCH
MEDICATED_PATCH | CUTANEOUS | Status: AC
  Filled 2013-03-13: qty 1

## 2013-03-13 MED ORDER — LIDOCAINE 5 % EX PTCH
1.0000 | MEDICATED_PATCH | CUTANEOUS | Status: DC
  Administered 2013-03-13 – 2013-03-17 (×5): 1 via TRANSDERMAL
  Filled 2013-03-13 (×5): qty 1

## 2013-03-13 MED ORDER — GABAPENTIN 100 MG PO CAPS
100.0000 mg | ORAL_CAPSULE | Freq: Three times a day (TID) | ORAL | Status: DC
  Administered 2013-03-13 – 2013-03-17 (×11): 100 mg via ORAL
  Filled 2013-03-13 (×11): qty 1

## 2013-03-13 MED ORDER — LATANOPROST 0.005 % OP SOLN
1.0000 [drp] | Freq: Every day | OPHTHALMIC | Status: DC
  Administered 2013-03-13 – 2013-03-16 (×4): 1 [drp] via OPHTHALMIC
  Filled 2013-03-13: qty 2.5

## 2013-03-13 MED ORDER — SODIUM CHLORIDE 0.9 % IV SOLN: INTRAVENOUS | Status: DC

## 2013-03-13 MED ORDER — SODIUM CHLORIDE 0.9 % IV BOLUS (SEPSIS)
500.0000 mL | Freq: Once | INTRAVENOUS | Status: AC
  Administered 2013-03-13: 500 mL via INTRAVENOUS

## 2013-03-13 MED ORDER — SODIUM CHLORIDE 0.9 % IJ SOLN
3.0000 mL | Freq: Two times a day (BID) | INTRAMUSCULAR | Status: DC
  Administered 2013-03-14 – 2013-03-17 (×7): 3 mL via INTRAVENOUS

## 2013-03-13 MED ORDER — SODIUM CHLORIDE 0.9 % IV SOLN
INTRAVENOUS | Status: DC
  Administered 2013-03-14 – 2013-03-16 (×5): via INTRAVENOUS

## 2013-03-13 MED ORDER — DOCUSATE SODIUM 100 MG PO CAPS
100.0000 mg | ORAL_CAPSULE | Freq: Two times a day (BID) | ORAL | Status: DC
  Administered 2013-03-13 – 2013-03-17 (×8): 100 mg via ORAL
  Filled 2013-03-13 (×8): qty 1

## 2013-03-13 MED ORDER — FLEET ENEMA 7-19 GM/118ML RE ENEM: 1.0000 | ENEMA | Freq: Once | RECTAL | Status: AC | PRN

## 2013-03-13 MED ORDER — ASPIRIN EC 81 MG PO TBEC
81.0000 mg | DELAYED_RELEASE_TABLET | Freq: Every day | ORAL | Status: DC
  Administered 2013-03-14 – 2013-03-17 (×4): 81 mg via ORAL
  Filled 2013-03-13 (×4): qty 1

## 2013-03-13 MED ORDER — PANTOPRAZOLE SODIUM 40 MG PO TBEC
40.0000 mg | DELAYED_RELEASE_TABLET | Freq: Every day | ORAL | Status: DC
  Administered 2013-03-14 – 2013-03-17 (×4): 40 mg via ORAL
  Filled 2013-03-13 (×4): qty 1

## 2013-03-13 MED ORDER — DEXTROSE 5 % IV SOLN
1.0000 g | Freq: Once | INTRAVENOUS | Status: AC
  Administered 2013-03-13: 1 g via INTRAVENOUS
  Filled 2013-03-13: qty 10

## 2013-03-13 MED ORDER — CEFTRIAXONE SODIUM 1 G IJ SOLR
1.0000 g | INTRAMUSCULAR | Status: DC
  Administered 2013-03-14: 1 g via INTRAVENOUS
  Filled 2013-03-13: qty 10

## 2013-03-13 MED ORDER — ONDANSETRON HCL 4 MG/2ML IJ SOLN: 4.0000 mg | INTRAMUSCULAR | Status: DC | PRN

## 2013-03-13 MED ORDER — FOLIC ACID 1 MG PO TABS
1.0000 mg | ORAL_TABLET | Freq: Every day | ORAL | Status: DC
  Administered 2013-03-14 – 2013-03-17 (×4): 1 mg via ORAL
  Filled 2013-03-13 (×4): qty 1

## 2013-03-13 NOTE — H&P (Signed)
[facility] History and Physical  Renee Leblanc  ZOX:[id number]  DOB: [birth year]   DOA: 03/13/2013   PCP:   Colette Ribas, MD   Chief Complaint:  Acute confusion  HPI: Renee Leblanc is a 79 y.o. female.   Elderly African American lady, who lives alone and is usually independent of all activities  of daily living, does her own cooking and cleaning, was brought in by her son, who last saw her on Friday but was concerned because he couldn't get in touch with her today. With the help of police deputies they were able to gain access to the house and found her lying face down in front of the bathroom. Inspection of the house suggested she had not slept in bed Saturday night (last night) and had therefore probably lying on the ground since then.  Patient was reportedly confused when found and did not realize she was lying down she thought she was standing up. She was found to have dependent bruising on her anterior knees, and is complaining of her left anterior chest pain. She denies fever chills or headache; he does have a history of frequency but denies dysuria  She has no known history of atrial fibrillation according to her son  Rewiew of Systems:   All systems negative except as marked bold or noted in the HPI;  Constitutional:    malaise, fever and chills. ;  Eyes:   eye pain, redness and discharge. ;  ENMT:   ear pain, hoarseness, nasal congestion, sinus pressure and sore throat. ;  Cardiovascular:    chest pain, palpitations, diaphoresis, dyspnea and peripheral edema.  Respiratory:   cough, hemoptysis, wheezing and stridor. ;  Gastrointestinal:  nausea, vomiting, diarrhea, constipation, abdominal pain, melena, blood in stool, hematemesis, jaundice and rectal bleeding. unusual weight loss..   Genitourinary:      , dysuria, incontinence,flank pain and hematuria; Musculoskeletal:   back pain and neck pain.  swelling and trauma.; Chronic elbow; leg pains Skin: .  pruritus,  rash, abrasions, bruising and skin lesion.; ulcerations Neuro:    headache, lightheadedness and neck stiffness.  weakness, altered level of consciousness, altered mental status, extremity weakness, burning feet, involuntary movement, seizure and syncope.  Psych:    anxiety, depression, insomnia, tearfulness, panic attacks, hallucinations, paranoia, suicidal or homicidal ideation    Past Medical History  Diagnosis Date  . Chronic back pain     3 yrs  . Knee pain, right   . Pernicious anemia   . Overactive bladder   . Diverticulosis of colon 09/20/2003    colonoscopy Dr Jena Gauss , inflammatory polyps  . Hemorrhoids   . Acute angle-closure glaucoma   . Hypertension   . GERD (gastroesophageal reflux disease)   . Osteoporosis   . Tubular adenoma of colon 12/18/10    Last colonosocpy Dr Jena Gauss 2 TA removed, anal papillae, internal hemorrhoids  . Hiatal hernia 12/18/10    On last EGD Dr Elmer Ramp otherwise    Past Surgical History  Procedure Laterality Date  . Appendectomy    . Total abdominal hysterectomy    . Tonsillectomy    . Hip fracture surgery      Medications:  HOME MEDS: Prior to Admission medications   Medication Sig Start Date End Date Taking? Authorizing Provider  Bisoprolol-Hydrochlorothiazide (ZIAC PO) Take by mouth.     Yes Historical Provider, MD  calcium carbonate 200 MG capsule Take 250 mg by mouth daily.     Yes Historical Provider, MD  docusate sodium (COLACE) 100 MG capsule Take 100 mg by mouth daily.     Yes Historical Provider, MD  folic acid (FOLVITE) 1 MG tablet Take 1 mg by mouth daily.     Yes Historical Provider, MD  furosemide (LASIX) 20 MG tablet Take 20 mg by mouth daily.     Yes Historical Provider, MD  gabapentin (NEURONTIN) 100 MG capsule Take 1 capsule (100 mg total) by mouth 3 (three) times daily. 09/02/12  Yes Vickki Hearing, MD  irbesartan (AVAPRO) 300 MG tablet Take 300 mg by mouth at bedtime.     Yes Historical Provider, MD  latanoprost  (XALATAN) 0.005 % ophthalmic solution Place 1 drop into both eyes at bedtime.     Yes Historical Provider, MD  lidocaine (LIDODERM) 5 % Place 1 patch onto the skin daily. Remove & Discard patch within 12 hours or as directed by MD  12/01/10  Yes Historical Provider, MD  metoprolol (LOPRESSOR) 50 MG tablet Take 0.5 tablets (25 mg total) by mouth 2 (two) times daily. 02/03/13  Yes Mihai Croitoru, MD  nabumetone (RELAFEN) 500 MG tablet Take 500 mg by mouth 2 (two) times daily.     Yes Historical Provider, MD  omeprazole (PRILOSEC) 20 MG capsule Take 20 mg by mouth daily.     Yes Historical Provider, MD  oxyCODONE-acetaminophen (TYLOX) 5-500 MG per capsule Take 1 capsule by mouth every 4 (four) hours as needed.     Yes Historical Provider, MD  potassium chloride SA (K-DUR,KLOR-CON) 20 MEQ tablet Take 20 mEq by mouth daily.     Yes Historical Provider, MD  Timolol Maleate (TIMOPTIC OP) Apply to eye.     Yes Historical Provider, MD  Vitamin B1-B12 5.5-0.0125 MG/5ML SOLN Inject 1 Syringe as directed every 30 (thirty) days.     Yes Historical Provider, MD     Allergies:  No Known Allergies  Social History:   reports that she has never smoked. She has never used smokeless tobacco. She reports that  drinks alcohol. She reports that she does not use illicit drugs.  Family History: History reviewed. No pertinent family history.   Physical Exam: Filed Vitals:   03/13/13 1843 03/13/13 1900 03/13/13 2013 03/13/13 2055  BP: 159/105 152/104 146/103   Pulse: 102  102   Temp:    97.8 F (36.6 C)  TempSrc:    Oral  Resp: 20 24 20    Height:    5\' 5"  (1.651 m)  Weight:    49.1 kg (108 lb 3.9 oz)  SpO2: 99% 98% 98%    Blood pressure 146/103, pulse 102, temperature 97.8 F (36.6 C), temperature source Oral, resp. rate 20, height 5\' 5"  (1.651 m), weight 49.1 kg (108 lb 3.9 oz), SpO2 98.00%. Body mass index is 18.01 kg/(m^2).   GEN:  Pleasant mildly confused elderly African American lady lying bed  complaining of left anterior chest pain; cooperative with exam PSYCH:  alert and oriented x2 the year as 2015, gives the month as June (my birth month);  neither anxious nor depressed; affect is appropriate. HEENT: Mucous membranes pink DRY and anicteric; PERRLA; EOM intact; no cervical lymphadenopathy nor thyromegaly or carotid bruit; no JVD; Breasts:: Not examined CHEST WALL: Superficial Bruise and tenderness over the left anterior chest CHEST: Normal respiration, clear to auscultation bilaterally HEART: Irregularly irregular rhythm; no murmurs rubs or gallops BACK: Kyphoscoliosis ; no CVA tenderness ABDOMEN: soft non-tender; no masses, no organomegaly, normal abdominal bowel sounds; no pannus; no intertriginous candida.  Rectal Exam: Not done EXTREMITIES:  age-appropriate arthropathy of the hands and knees; extensive bruising of both knees right greater than left; no edema; no ulcerations. Genitalia: not examined PULSES: 2+ and symmetric SKIN: Normal hydration no rash or ulceration CNS: Cranial nerves 2-12 grossly intact no focal lateralizing neurologic deficit   Labs on Admission:  Basic Metabolic Panel:  Recent Labs Lab 03/13/13 1608  NA 143  K 3.5  CL 101  CO2 30  GLUCOSE 147*  BUN 30*  CREATININE 1.65*  CALCIUM 10.1   Liver Function Tests:  Recent Labs Lab 03/13/13 1608  AST 46*  ALT 31  ALKPHOS 92  BILITOT 1.2  PROT 8.0  ALBUMIN 4.0   No results found for this basename: LIPASE, AMYLASE,  in the last 168 hours No results found for this basename: AMMONIA,  in the last 168 hours CBC:  Recent Labs Lab 03/13/13 1608  WBC 15.2*  NEUTROABS 12.8*  HGB 17.4*  HCT 51.8*  MCV 89.6  PLT 174   Cardiac Enzymes:  Recent Labs Lab 03/13/13 1608  CKTOTAL 1147*  TROPONINI <0.30   BNP: No components found with this basename: POCBNP,  D-dimer: No components found with this basename: D-DIMER,  CBG: No results found for this basename: GLUCAP,  in the last 168  hours  Radiological Exams on Admission: Dg Pelvis 1-2 Views  03/13/2013   *RADIOLOGY REPORT*  Clinical Data: Trauma.  Left pelvic pain.  PELVIS - 1-2 VIEW  Comparison: CT 01/23/2011  Findings: Single view of the pelvis was obtained. Surgical fixation of the proximal right femur with a dynamic hip screw.  Mild degenerative changes in both hips.  The bony pelvic ring is intact. Multiple phleboliths throughout the pelvis.  IMPRESSION: No acute bony abnormality to the pelvis.   Original Report Authenticated By: Richarda Overlie, M.D.   Ct Head Wo Contrast  03/13/2013   *RADIOLOGY REPORT*  Clinical Data: Fall.  Altered mental status  CT HEAD WITHOUT CONTRAST  Technique:  Contiguous axial images were obtained from the base of the skull through the vertex without contrast.  Comparison: None  Findings: Moderate atrophy.  Chronic microvascular ischemic changes.  Hypodensity left pons is most likely chronic.  No acute infarct.  Negative for hemorrhage or mass lesion.  No focal skull lesion.  Air-fluid level left maxillary sinus.  IMPRESSION: Atrophy and chronic microvascular ischemia.  No acute infarct or hemorrhage.  Air-fluid level left maxillary sinus.   Original Report Authenticated By: Janeece Riggers, M.D.   Dg Knee Complete 4 Views Left  03/13/2013   *RADIOLOGY REPORT*  Clinical Data: Trauma and knee pain.  LEFT KNEE - COMPLETE 4+ VIEW  Comparison: None.  Findings: Joint space narrowing in the medial knee compartment with osteophyte formations.  No significant suprapatellar joint effusion.  The knee appears to be located.  No evidence for acute fracture.  IMPRESSION: Degenerative changes without acute bony abnormality.   Original Report Authenticated By: Richarda Overlie, M.D.   Dg Knee Complete 4 Views Right  03/13/2013   *RADIOLOGY REPORT*  Clinical Data: Trauma and knee pain.  RIGHT KNEE - COMPLETE 4+ VIEW  Comparison: 12/22/2008  Findings: Severe joint space narrowing involving the medial knee compartment with  osteophyte formations.  Evidence for a small suprapatellar joint effusion.  No evidence for a fracture or dislocation.  IMPRESSION: Severe degenerative changes in the right knee.  No acute bony abnormality.   Original Report Authenticated By: Richarda Overlie, M.D.    EKG: Independently reviewed. Atrial  fibrillation   Assessment/Plan   Active Problems:   Sciatica   DDD (degenerative disc disease), lumbar   Urinary tract infection   Fall   Dehydration   Delirium due to general medical condition afib   PLAN: We'll admit this lady for hydration. Rocephin for urinary tract infection pending culture MRI of the brain in case she is had a stroke secondary to A. Fib Social work consult   Other plans as per orders.  Code Status: Full code  Family Communication:  Plans discuss with patient and family at bedside Disposition Plan: Depending on hospital   Abrian Hanover Nocturnist [facility] Pager [phone]   03/13/2013, 9:23 PM

## 2013-03-13 NOTE — ED Provider Notes (Signed)
History    This chart was scribed for Donnetta Hutching, MD by Quintella Reichert, ED scribe.  This patient was seen in room APA18/APA18 and the patient's care was started at 3:19 PM.   CSN: [id number]  Arrival date & time 03/13/13  1447    Chief Complaint  Patient presents with  . Fall  . Altered Mental Status    The history is provided by the patient, the EMS personnel and a relative. No language interpreter was used.    Level 5 Caveat: Altered Mental Status   HPI Comments: Renee Leblanc is a 79 y.o. female brought by EMS to the Emergency Department complaining of an unwitnessed fall that occurred pta, with subsequent injuries to multiple areas.  Pt's son reports that he went to pt's house and became concerned when she would not answer her door.  He called Patent examiner, who broke a window in order to help son enter the house.  He states that they found pt laying face down coming out of her bathroom and he notes that there was food cooking on her stove and the stove was still on.  When he spoke to her he notes that she was disoriented and thought that she was standing at her sink while she was still lying on the floor.  When EMS arrived they noted multiple abrasions and swelling to the hips and knees.  Pt does not recall how she arrived in the emergency department or what happened prior to arrival.  She expresses ignorance of any injuries or pain.  She is oriented to person but not to place.  She is able to identify the day of the week but per nurse's report is unsure of the year.  Pt's son reports that at baseline she is fully-oriented and coherent and ambulates with a walker.  She lives at home and has declined to be transferred to an assisted living facility.  Pt had a right hip replacement in 2008.      Past Medical History  Diagnosis Date  . Chronic back pain     3 yrs  . Knee pain, right   . Pernicious anemia   . Overactive bladder   . Diverticulosis of colon 09/20/2003     colonoscopy Dr Jena Gauss , inflammatory polyps  . Hemorrhoids   . Acute angle-closure glaucoma   . Hypertension   . GERD (gastroesophageal reflux disease)   . Osteoporosis   . Tubular adenoma of colon 12/18/10    Last colonosocpy Dr Jena Gauss 2 TA removed, anal papillae, internal hemorrhoids  . Hiatal hernia 12/18/10    On last EGD Dr Elmer Ramp otherwise    Past Surgical History  Procedure Laterality Date  . Appendectomy    . Total abdominal hysterectomy    . Tonsillectomy    . Hip fracture surgery      No family history on file.   History  Substance Use Topics  . Smoking status: Never Smoker   . Smokeless tobacco: Never Used  . Alcohol Use: Yes     Comment: wine each night before dinner    OB History   Grav Para Term Preterm Abortions TAB SAB Ect Mult Living                   Review of Systems  Unable to perform ROS: Mental status change      Allergies  Review of patient's allergies indicates no known allergies.  Home Medications   Current Outpatient Rx  Name  Route  Sig  Dispense  Refill  . alendronate (FOSAMAX) 70 MG tablet   Oral   Take 70 mg by mouth every 7 (seven) days. Take with a full glass of water on an empty stomach.          Marland Kitchen AmLODIPine Besylate (NORVASC PO)   Oral   Take by mouth.           . Bisoprolol-Hydrochlorothiazide (ZIAC PO)   Oral   Take by mouth.           . calcium carbonate 200 MG capsule   Oral   Take 250 mg by mouth daily.           Marland Kitchen docusate sodium (COLACE) 100 MG capsule   Oral   Take 100 mg by mouth daily.           . folic acid (FOLVITE) 1 MG tablet   Oral   Take 1 mg by mouth daily.           . furosemide (LASIX) 20 MG tablet   Oral   Take 20 mg by mouth daily.           Marland Kitchen gabapentin (NEURONTIN) 100 MG capsule   Oral   Take 1 capsule (100 mg total) by mouth 3 (three) times daily.   90 capsule   1   . irbesartan (AVAPRO) 300 MG tablet   Oral   Take 300 mg by mouth at bedtime.            Marland Kitchen latanoprost (XALATAN) 0.005 % ophthalmic solution   Both Eyes   Place 1 drop into both eyes at bedtime.           . lidocaine (LIDODERM) 5 %   Transdermal   Place 1 patch onto the skin daily. Remove & Discard patch within 12 hours or as directed by MD          . metoprolol (LOPRESSOR) 50 MG tablet   Oral   Take 0.5 tablets (25 mg total) by mouth 2 (two) times daily.   60 tablet   11   . nabumetone (RELAFEN) 500 MG tablet   Oral   Take 500 mg by mouth 2 (two) times daily.           Marland Kitchen omeprazole (PRILOSEC) 20 MG capsule   Oral   Take 20 mg by mouth daily.           Marland Kitchen oxyCODONE-acetaminophen (TYLOX) 5-500 MG per capsule   Oral   Take 1 capsule by mouth every 4 (four) hours as needed.           . potassium chloride SA (K-DUR,KLOR-CON) 20 MEQ tablet   Oral   Take 20 mEq by mouth daily.           . Timolol Maleate (TIMOPTIC OP)   Ophthalmic   Apply to eye.           . Vitamin B1-B12 5.5-0.0125 MG/5ML SOLN   Injection   Inject 1 Syringe as directed every 30 (thirty) days.            BP 145/91  Pulse 89  Temp(Src) 97.8 F (36.6 C) (Oral)  Resp 18  Ht 5\' 5"  (1.651 m)  Wt 112 lb (50.803 kg)  BMI 18.64 kg/m2  SpO2 98%  Physical Exam  Nursing note and vitals reviewed. Constitutional: She appears well-developed and well-nourished.  HENT:  Head: Normocephalic and atraumatic.  Eyes: Conjunctivae and EOM are  normal. Pupils are equal, round, and reactive to light.  Neck: Normal range of motion. Neck supple.  Cardiovascular: Normal rate, regular rhythm and normal heart sounds.   Pulmonary/Chest: Effort normal and breath sounds normal.  Abdominal: Soft. Bowel sounds are normal.  Musculoskeletal: Normal range of motion.  Area of erythema and ecchymosis on bilateral hips (left greater than right), bilateral knees (right greater than left), and bilateral proximal tibia area (right greater than left). Tender on palpation to bilateral hips.  Neurological: She  is alert.  Disoriented  Skin: Skin is warm and dry.  Psychiatric: She has a normal mood and affect.    ED Course  Procedures (including critical care time)  DIAGNOSTIC STUDIES: Oxygen Saturation is 98% on room air, normal by my interpretation.    COORDINATION OF CARE: 3:27 PM-Discussed treatment plan which includes EKG, labs and imaging with pt at bedside and pt agreed to plan.      Date: 03/13/2013  Rate: 85  Rhythm: atrial fibrillation  QRS Axis: normal  Intervals: normal  ST/T Wave abnormalities: normal  Conduction Disutrbances:none  Narrative Interpretation:   Old EKG Reviewed: changes noted    Results for orders placed during the hospital encounter of 03/13/13  COMPREHENSIVE METABOLIC PANEL      Result Value Range   Sodium 143  135 - 145 mEq/L   Potassium 3.5  3.5 - 5.1 mEq/L   Chloride 101  96 - 112 mEq/L   CO2 30  19 - 32 mEq/L   Glucose, Bld 147 (*) 70 - 99 mg/dL   BUN 30 (*) 6 - 23 mg/dL   Creatinine, Ser 9.60 (*) 0.50 - 1.10 mg/dL   Calcium 45.4  8.4 - 09.8 mg/dL   Total Protein 8.0  6.0 - 8.3 g/dL   Albumin 4.0  3.5 - 5.2 g/dL   AST 46 (*) 0 - 37 U/L   ALT 31  0 - 35 U/L   Alkaline Phosphatase 92  39 - 117 U/L   Total Bilirubin 1.2  0.3 - 1.2 mg/dL   GFR calc non Af Amer 25 (*) >90 mL/min   GFR calc Af Amer 30 (*) >90 mL/min  CBC WITH DIFFERENTIAL      Result Value Range   WBC 15.2 (*) 4.0 - 10.5 K/uL   RBC 5.78 (*) 3.87 - 5.11 MIL/uL   Hemoglobin 17.4 (*) 12.0 - 15.0 g/dL   HCT 11.9 (*) 14.7 - 82.9 %   MCV 89.6  78.0 - 100.0 fL   MCH 30.1  26.0 - 34.0 pg   MCHC 33.6  30.0 - 36.0 g/dL   RDW 56.2  13.0 - 86.5 %   Platelets 174  150 - 400 K/uL   Neutrophils Relative % 84 (*) 43 - 77 %   Neutro Abs 12.8 (*) 1.7 - 7.7 K/uL   Lymphocytes Relative 7 (*) 12 - 46 %   Lymphs Abs 1.1  0.7 - 4.0 K/uL   Monocytes Relative 8  3 - 12 %   Monocytes Absolute 1.3 (*) 0.1 - 1.0 K/uL   Eosinophils Relative 0  0 - 5 %   Eosinophils Absolute 0.0  0.0 - 0.7 K/uL    Basophils Relative 0  0 - 1 %   Basophils Absolute 0.0  0.0 - 0.1 K/uL  TROPONIN I      Result Value Range   Troponin I <0.30  <0.30 ng/mL     Dg Pelvis 1-2 Views  03/13/2013   *RADIOLOGY  REPORT*  Clinical Data: Trauma.  Left pelvic pain.  PELVIS - 1-2 VIEW  Comparison: CT 01/23/2011  Findings: Single view of the pelvis was obtained. Surgical fixation of the proximal right femur with a dynamic hip screw.  Mild degenerative changes in both hips.  The bony pelvic ring is intact. Multiple phleboliths throughout the pelvis.  IMPRESSION: No acute bony abnormality to the pelvis.   Original Report Authenticated By: Richarda Overlie, M.D.    Ct Head Wo Contrast  03/13/2013   *RADIOLOGY REPORT*  Clinical Data: Fall.  Altered mental status  CT HEAD WITHOUT CONTRAST  Technique:  Contiguous axial images were obtained from the base of the skull through the vertex without contrast.  Comparison: None  Findings: Moderate atrophy.  Chronic microvascular ischemic changes.  Hypodensity left pons is most likely chronic.  No acute infarct.  Negative for hemorrhage or mass lesion.  No focal skull lesion.  Air-fluid level left maxillary sinus.  IMPRESSION: Atrophy and chronic microvascular ischemia.  No acute infarct or hemorrhage.  Air-fluid level left maxillary sinus.   Original Report Authenticated By: Janeece Riggers, M.D.    Dg Knee Complete 4 Views Left  03/13/2013   *RADIOLOGY REPORT*  Clinical Data: Trauma and knee pain.  LEFT KNEE - COMPLETE 4+ VIEW  Comparison: None.  Findings: Joint space narrowing in the medial knee compartment with osteophyte formations.  No significant suprapatellar joint effusion.  The knee appears to be located.  No evidence for acute fracture.  IMPRESSION: Degenerative changes without acute bony abnormality.   Original Report Authenticated By: Richarda Overlie, M.D.    Dg Knee Complete 4 Views Right  03/13/2013   *RADIOLOGY REPORT*  Clinical Data: Trauma and knee pain.  RIGHT KNEE - COMPLETE 4+ VIEW   Comparison: 12/22/2008  Findings: Severe joint space narrowing involving the medial knee compartment with osteophyte formations.  Evidence for a small suprapatellar joint effusion.  No evidence for a fracture or dislocation.  IMPRESSION: Severe degenerative changes in the right knee.  No acute bony abnormality.   Original Report Authenticated By: Richarda Overlie, M.D.     No diagnosis found.   CRITICAL CARE Performed by: Donnetta Hutching  ?  Total critical care time: 30  Critical care time was exclusive of separately billable procedures and treating other patients.  Critical care was necessary to treat or prevent imminent or life-threatening deterioration.  Critical care was time spent personally by me on the following activities: development of treatment plan with patient and/or surrogate as well as nursing, discussions with consultants, evaluation of patient's response to treatment, examination of patient, obtaining history from patient or surrogate, ordering and performing treatments and interventions, ordering and review of laboratory studies, ordering and review of radiographic studies, pulse oximetry and re-evaluation of patient's condition.  MDM  Patient was found on floor at her home by her son. She was confused and disoriented. CT head negative for acute findings.   plain films of right knee, left knee, pelvis all negative for fracture.    Urinalysis grossly positive for infection.   Will hydrate, IV Rocephin, urine cultures, admit.           Donnetta Hutching, MD 03/13/13 1919

## 2013-03-13 NOTE — ED Notes (Signed)
Pt cleaned of incont. Urine, pt was wearing only a dress shirt and socks, both covered in urine, no pants or underwear noted on pt at arrival. Placed into gown.

## 2013-03-13 NOTE — ED Notes (Signed)
Pt's son at bedside. Pt resting at bedside. Voices no complaints. Remains confused, did not recognize her daughter in law.

## 2013-03-13 NOTE — ED Notes (Signed)
Pt talking w. Son who is at bedside. Confused. Son stated that house was smoky from potatoes that were on the stove and had cooked down, pt stated "I cooked those yesterday"

## 2013-03-13 NOTE — ED Notes (Signed)
Pt arrived from her home by ems, local police had to break a window to get into her home, she was found laying face down in the floor, multiple abrasions and swelling noted to left hip, left knee, and right knee. Pt unable to tell staff what has recently occurred. Thinks today is Saturday, unsure of year. Oriented to self.

## 2013-03-13 NOTE — ED Notes (Signed)
Dr.cook to bedside of pt for exam. Pt tolerated well.

## 2013-03-13 NOTE — ED Notes (Signed)
Pt cleaned of incont. Urine, linens and gown changed. Pt remains confused.

## 2013-03-14 ENCOUNTER — Inpatient Hospital Stay (HOSPITAL_COMMUNITY): Payer: Medicare Other

## 2013-03-14 DIAGNOSIS — I4891 Unspecified atrial fibrillation: Secondary | ICD-10-CM

## 2013-03-14 DIAGNOSIS — W19XXXA Unspecified fall, initial encounter: Secondary | ICD-10-CM

## 2013-03-14 DIAGNOSIS — N179 Acute kidney failure, unspecified: Secondary | ICD-10-CM | POA: Diagnosis present

## 2013-03-14 DIAGNOSIS — F05 Delirium due to known physiological condition: Secondary | ICD-10-CM

## 2013-03-14 HISTORY — DX: Unspecified atrial fibrillation: I48.91

## 2013-03-14 LAB — HEMOGLOBIN A1C: Mean Plasma Glucose: 111 mg/dL (ref <117)

## 2013-03-14 LAB — COMPREHENSIVE METABOLIC PANEL
Alkaline Phosphatase: 78 U/L (ref 39–117)
BUN: 32 mg/dL — ABNORMAL HIGH (ref 6–23)
Chloride: 101 mEq/L (ref 96–112)
GFR calc Af Amer: 36 mL/min — ABNORMAL LOW (ref >90)
Glucose, Bld: 128 mg/dL — ABNORMAL HIGH (ref 70–99)
Potassium: 2.7 mEq/L — CL (ref 3.5–5.1)
Total Bilirubin: 0.6 mg/dL (ref 0.3–1.2)
Total Protein: 7.2 g/dL (ref 6.0–8.3)

## 2013-03-14 LAB — CBC
HCT: 47.1 % — ABNORMAL HIGH (ref 36.0–46.0)
Hemoglobin: 15.7 g/dL — ABNORMAL HIGH (ref 12.0–15.0)
MCHC: 33.3 g/dL (ref 30.0–36.0)

## 2013-03-14 LAB — TROPONIN I: Troponin I: 0.41 ng/mL (ref <0.30)

## 2013-03-14 MED ORDER — POTASSIUM CHLORIDE 10 MEQ/100ML IV SOLN
10.0000 meq | INTRAVENOUS | Status: AC
  Administered 2013-03-14 (×3): 10 meq via INTRAVENOUS
  Filled 2013-03-14: qty 400

## 2013-03-14 MED ORDER — DILTIAZEM HCL 100 MG IV SOLR
5.0000 mg/h | INTRAVENOUS | Status: DC
  Administered 2013-03-14: 15 mg/h via INTRAVENOUS
  Filled 2013-03-14: qty 100

## 2013-03-14 MED ORDER — POTASSIUM CHLORIDE 10 MEQ/100ML IV SOLN
10.0000 meq | INTRAVENOUS | Status: AC
  Administered 2013-03-14 (×4): 10 meq via INTRAVENOUS
  Filled 2013-03-14: qty 200
  Filled 2013-03-14: qty 100

## 2013-03-14 MED ORDER — HYDRALAZINE HCL 20 MG/ML IJ SOLN
10.0000 mg | INTRAMUSCULAR | Status: DC | PRN
  Administered 2013-03-14: 10 mg via INTRAVENOUS
  Filled 2013-03-14: qty 1

## 2013-03-14 MED ORDER — POTASSIUM CHLORIDE CRYS ER 20 MEQ PO TBCR
40.0000 meq | EXTENDED_RELEASE_TABLET | Freq: Once | ORAL | Status: AC
  Administered 2013-03-14: 40 meq via ORAL
  Filled 2013-03-14: qty 2

## 2013-03-14 MED ORDER — POTASSIUM CHLORIDE CRYS ER 20 MEQ PO TBCR
40.0000 meq | EXTENDED_RELEASE_TABLET | Freq: Two times a day (BID) | ORAL | Status: DC
  Administered 2013-03-14 – 2013-03-15 (×4): 40 meq via ORAL
  Filled 2013-03-14 (×4): qty 2

## 2013-03-14 MED ORDER — DILTIAZEM HCL 30 MG PO TABS
30.0000 mg | ORAL_TABLET | Freq: Four times a day (QID) | ORAL | Status: DC
  Administered 2013-03-14 – 2013-03-15 (×4): 30 mg via ORAL
  Filled 2013-03-14 (×4): qty 1

## 2013-03-14 MED ORDER — TIMOLOL MALEATE 0.5 % OP SOLN
1.0000 [drp] | Freq: Every morning | OPHTHALMIC | Status: DC
  Administered 2013-03-15 – 2013-03-17 (×3): 1 [drp] via OPHTHALMIC
  Filled 2013-03-14: qty 5

## 2013-03-14 NOTE — Progress Notes (Signed)
UR chart review completed.  

## 2013-03-14 NOTE — Progress Notes (Signed)
[facility] PROGRESS NOTE  DAVEN PINCKNEY ZOX:[id number] DOB: April 11, 1917 DOA: 03/13/2013 PCP: Colette Ribas, MD  Assessment/Plan: 1. Unwitnessed fall at home: History unclear, patient denies syncope or focal weakness. Exam nonfocal. Follow clinically. Possibly was secondary to acute infection. Physical therapy when improved. Multiple abrasions noted to extremities. Extremity imaging was negative for fracture the patient currently has good range of movement. 2. Atrial fibrillation with rapid ventricular response: New diagnosis. Start diltiazem infusion. Check 2-D echocardiogram and TSH. Serial cardiac enzymes. 3. UTI: Continue empiric Rocephin. Followup culture. 4. Acute renal failure: Suspect secondary to prolonged immobility on the floor. Check CK, assess for rhabdomyolysis. Continue IV fluids. If fails to improve could consider renal ultrasound. 5. Positive troponin, chest pain: Significance unclear, initial troponin was negative. May be related to rapid ventricular rate. No signs or symptoms of ACS. Cycle cardiac enzymes. The patient denies pain, but this was recorded in the chart.. 6. Hypokalemia: Replete. Check serum magnesium. Repeat basic metabolic panel in the morning. 7. Acute encephalopathy: Appears to be resolving. Patient's alert and oriented to self, location, month but not year. No focal deficits to suggest CVA and CT head was negative. MRI brain pending.   Check serum CK now and in the morning  Bolus IV diltiazem, starting infusion. Serial cardiac enzymes. 2-D echocardiogram. TSH.  Replace potassium. Check serum magnesium in the morning.  Followup MRI  Physical therapy when stable   Code Status: Full code  DVT prophylaxis: Lovenox  Family Communication: None present  Disposition Plan: Pending further evaluation   Brendia Sacks, MD  [facility]  Pager [phone] If 7PM-7AM, please contact night-coverage at [email or web address], password [facility] 03/14/2013,  8:44 AM  LOS: 1 day   Clinical Summary: 79 year old woman lives home alone, independent with all ADLs who presented to the emergency department after she was found on the floor for an unknown length of time by her son. The patient reports that she fell but cannot report any further details. She denies passing out. No focal deficits were reported. In emergency department she was noted to be confused had superficial bruising and have new-onset atrial fibrillation. Initial workup was notable for acute renal failure, UTI and patient was referred for admission.   Consultants:    Procedures:    Antibiotics:  Ceftriaxone 7/13 >>  HPI/Subjective: Overnight the patient developed atrial fibrillation with rapid ventricular response. Remains asymptomatic, no chest or shortness of breath. Her arms feels that but she otherwise feels quite well. No nausea, vomiting or, pain.  Objective: Filed Vitals:   03/14/13 0300 03/14/13 0400 03/14/13 0500 03/14/13 0600  BP: 135/99 145/96 124/83 121/77  Pulse:  105 95 95  Temp:  98.3 F (36.8 C)    TempSrc:  Oral    Resp: 17 20 15 16   Height:      Weight:   50 kg (110 lb 3.7 oz)   SpO2:  97% 98% 98%    Intake/Output Summary (Last 24 hours) at 03/14/13 0844 Last data filed at 03/14/13 0612  Gross per 24 hour  Intake 1988.75 ml  Output      0 ml  Net 1988.75 ml     Filed Weights   03/13/13 1610 03/13/13 2055 03/14/13 0500  Weight: 50.803 kg (112 lb) 49.1 kg (108 lb 3.9 oz) 50 kg (110 lb 3.7 oz)    Exam:   Afebrile. Heart rate now 130s, atrial fib/flutter. Normotensive, no hypoxia.  General: Examined in the step down unit. Appears calm, comfortable. Currently eating  breakfast. Appears well.  Eyes: Pupils equal, round, react to light. Normal lids, irises.  ENT: Grossly normal hearing. Lips and tongue appear unremarkable.  Neck: Appears unremarkable.  Cardiovascular: Tachycardic, irregular. No murmur, rub, gallop. No lower extremity  edema.  Respiratory: Clear to auscultation bilaterally. No wheezes, rales, rhonchi. Normal respiratory effort.  Abdomen: Soft, nontender, nondistended.    Musculoskeletal: Grossly normal tone and strength all extremities.    psychiatric: Grossly normal mood and affect. Speech fluent and appropriate  Data Reviewed:  Basic metabolic panel notable for potassium 2.7  , improved creatinine 1.4, stable elevation of AST   TROPONIN I elevated <0.3 >>> 0.41  Hemoglobin decreased to 15.7. White blood cell count decreased 13.1.  Urinalysis grossly positive  X-rays of right, left knee, pelvis unremarkable. CT of the head negative.  EKG 7/13: Atrial fib/flutter  Pending studies:   Urine culture pending  Chest x-ray    TSH, hemoglobin A1c  Scheduled Meds: . aspirin EC  81 mg Oral Daily  . cefTRIAXone (ROCEPHIN)  IV  1 g Intravenous Q24H  . docusate sodium  100 mg Oral BID  . enoxaparin (LOVENOX) injection  30 mg Subcutaneous Q24H  . folic acid  1 mg Oral Daily  . gabapentin  100 mg Oral TID  . latanoprost  1 drop Both Eyes QHS  . lidocaine  1 patch Transdermal Q24H  . metoprolol  25 mg Oral BID  . pantoprazole  40 mg Oral Daily  . potassium chloride  10 mEq Intravenous Q1 Hr x 4  . sodium chloride  3 mL Intravenous Q12H   Continuous Infusions: . sodium chloride 125 mL/hr at 03/14/13 0600    Active Problems:   Sciatica   DDD (degenerative disc disease), lumbar   Urinary tract infection   Fall   Dehydration   Delirium due to general medical condition   Time spent 30 minutes

## 2013-03-14 NOTE — Progress Notes (Signed)
Attempted foley catheter placement as ordered; unable to obtain x 2 tries. Offered bedpan, unable to void will continue to monitor patient.

## 2013-03-14 NOTE — Progress Notes (Signed)
Critical K+2.7 and critical troponin 0.41 text paged to Dr. Orvan Falconer.

## 2013-03-14 NOTE — Clinical Social Work Placement (Signed)
    Clinical Social Work Department CLINICAL SOCIAL WORK PLACEMENT NOTE 03/16/2013  Patient:  Renee Leblanc, Renee Leblanc  Account Number:  [id number] Admit date:  03/13/2013  Clinical Social Worker:  Santa Genera, CLINICAL SOCIAL WORKER  Date/time:  03/14/2013 10:30 AM  Clinical Social Work is seeking post-discharge placement for this patient at the following level of care:   SKILLED NURSING   (*CSW will update this form in Epic as items are completed)   03/14/2013  Patient/family provided with [facility] Department of Clinical Social Work's list of facilities offering this level of care within the geographic area requested by the patient (or if unable, by the patient's family).  03/14/2013  Patient/family informed of their freedom to choose among providers that offer the needed level of care, that participate in Medicare, Medicaid or managed care program needed by the patient, have an available bed and are willing to accept the patient.  03/14/2013  Patient/family informed of [facility]' ownership interest in [facility], as well as of the fact that they are under no obligation to receive care at this facility.  PASARR submitted to EDS on 03/14/2013 PASARR number received from EDS on 03/14/2013  FL2 transmitted to all facilities in geographic area requested by pt/family on  03/14/2013 FL2 transmitted to all facilities within larger geographic area on 03/14/2013  Patient informed that his/her managed care company has contracts with or will negotiate with  certain facilities, including the following:     Patient/family informed of bed offers received:  03/15/2013 Patient chooses bed at [facility] Physician recommends and patient chooses bed at    Patient to be transferred to  on   Patient to be transferred to facility by   The following physician request were entered in Epic:   Additional Comments: Patient had preexisting PASARR.  Patient states that she wants to  return home, but willing to consider SNF offers.  Santa Genera, LCSW Clinical Social Worker [phone])

## 2013-03-14 NOTE — Clinical Social Work Psychosocial (Signed)
    Clinical Social Work Department BRIEF PSYCHOSOCIAL ASSESSMENT 03/14/2013  Patient:  Renee Leblanc, Renee Leblanc     Account Number:  [id number]     Admit date:  03/13/2013  Clinical Social Worker:  Santa Genera, CLINICAL SOCIAL WORKER  Date/Time:  03/14/2013 09:00 AM  Referred by:  Physician  Date Referred:  03/14/2013 Referred for  Other - See comment   Other Referral:   Lives alone   Interview type:  Patient Other interview type:    PSYCHOSOCIAL DATA Living Status:  ALONE Admitted from facility:   Level of care:   Primary support name:  Okey Regal Primary support relationship to patient:  CHILD, ADULT Degree of support available:   Limited, patient lives alone and family visits once/week. Patient found down on floor for unclear length of time w food cooking on stove.    CURRENT CONCERNS Current Concerns  Post-Acute Placement   Other Concerns:    SOCIAL WORK ASSESSMENT / PLAN CSW met w patient, patient appears oriented to self and place, knows it is "summer."  Patient lives alone and has done so for quite some time. Is a retired Runner, broadcasting/film/video in [place]. Says she is an only child, brother deceased. Has son in [place], a Runner, broadcasting/film/video, who checks on her weekly and calls nightly.  Per history, son became concerned when patient did not answer door, sought police assistance to enter house, found patient lying on floor w food cooking on stove for unknown length of time.  Patient did not know she was on floor, thought she was cooking at the time.    Patient says she drives car, is able to shop for groceries, says she belongs to several "social clubs" and attends meetings.  Is church member but says she does not attend regular due to"overactive bladder."  Patient insists that she is able to take care of herself independently.  Per report, son says patient is independent in all ADLs.    Patient did say she was married to "Dr Julien Girt, a psychiatrist who used to practice here."  Says  husband moved to [place] "a long time ago" and died a few years ago.  CSW cannot confirm the validity of this statement.    CSW explained process of PT evaluation to determine level of care anticipated at discharge.  Patient says she is willing to consider alternatives to returning home if rehab is needed.  Asked CSW not to call son, patient says she will speak w him.  CSW encouraged patient to gather data from son and see if he had any concerns about her living alone.  CSW notes that patient was found on floor at home w food cooking on stove for unknown period of time.  Although patient appears oriented to self, place and somewhat to time, she also may be experiencing difficulty w maintaining independent living and may need alternatives at discharge. CSW will continue to monitor and discuss options w patient.   Assessment/plan status:  Psychosocial Support/Ongoing Assessment of Needs Other assessment/ plan:   Information/referral to community resources:   Will give SNF/ALF list when/if appropriate.    PATIENT'S/FAMILY'S RESPONSE TO PLAN OF CARE: Patient willing to listen to alternatives to returning home at discharge but clearly wants to return home.  Does not want CSW to contact family.     Santa Genera, LCSW Clinical Social Worker [phone])

## 2013-03-14 NOTE — Progress Notes (Signed)
*  PRELIMINARY RESULTS* Echocardiogram 2D Echocardiogram has been performed.  Renee Leblanc 03/14/2013, 2:59 PM

## 2013-03-14 NOTE — Evaluation (Signed)
Physical Therapy Evaluation Patient Details Name: Renee Leblanc MRN: [id number] DOB: [birth year] Today's Date: 03/14/2013 Time: 4098-1191 PT Time Calculation (min): 39 min  PT Assessment / Plan / Recommendation History of Present Illness  Pt is a 79 year old who was apparently independent at home and was found by her son, facedown on the floor at her home.  She is admitted with UTI, dehydration and Afib.  She is currently on cardizem drip.  The confusion she had initiallty appears to have resolved.  Clinical Impression   Pt was seen for evaluation and was found to have a significant amount of limitation of mobility due to this recent illness/fall  At home.  She is alert and very pleasant, able to follow all directions.  Transfers and gait are very slow and labored, needing minimal to moderate assistance.  She was very surprised at each element of mobility testing that she needed any assistance at all.  She may be in some denial as to the severity of her illness.  I am strongly recommending SNF at d/c.    PT Assessment  Patient needs continued PT services    Follow Up Recommendations  SNF    Does the patient have the potential to tolerate intense rehabilitation    No  Barriers to Discharge Decreased caregiver support      Equipment Recommendations  None recommended by PT    Recommendations for Other Services OT consult   Frequency Min 3X/week    Precautions / Restrictions Precautions Precautions: Fall Restrictions Weight Bearing Restrictions: No   Pertinent Vitals/Pain       Mobility  Bed Mobility Bed Mobility: Supine to Sit Supine to Sit: 3: Mod assist;HOB flat Details for Bed Mobility Assistance: transfer was very labored from supine to sit Transfers Transfers: Sit to Stand;Stand to Sit Sit to Stand: 4: Min assist;With upper extremity assist;From bed Stand to Sit: 5: Supervision;To chair/3-in-1;With upper extremity assist Details for Transfer Assistance: Pt was  quite surprised at the amount of difficulty she had with trying to stand Ambulation/Gait Ambulation/Gait Assistance: 4: Min assist Ambulation Distance (Feet): 15 Feet Assistive device: Rolling walker Gait Pattern: Decreased step length - left;Decreased stance time - left;Decreased hip/knee flexion - left;Trunk flexed Gait velocity: very slow and labored General Gait Details: both knees are quite bruised with edema of right knee...although her strength and ROM are WNL, she has significant difficulty raising the right foot off of the floor in order to take a step...she denies pain in either knee Stairs: No Wheelchair Mobility Wheelchair Mobility: No    Exercises General Exercises - Lower Extremity Ankle Circles/Pumps: AROM;Both;5 reps;Supine Heel Slides: AROM;Both;5 reps;Supine Hip ABduction/ADduction: AROM;Both;5 reps;Supine   PT Diagnosis: Difficulty walking;Abnormality of gait;Generalized weakness  PT Problem List: Decreased strength;Decreased activity tolerance;Decreased mobility;Decreased knowledge of use of DME;Decreased safety awareness;Cardiopulmonary status limiting activity PT Treatment Interventions: Gait training;Functional mobility training;Therapeutic exercise;Patient/family education     PT Goals(Current goals can be found in the care plan section) Acute Rehab PT Goals Patient Stated Goal: pt is hoping to return home with full independence PT Goal Formulation: With patient Time For Goal Achievement: 03/28/13 Potential to Achieve Goals: Good  Visit Information  Last PT Received On: 03/14/13 History of Present Illness: Pt is a 79 year old who was apparently independent at home and was found by her son, facedown on the floor at her home.  She is admitted with UTI, dehydration and Afib.  She is currently on cardizem drip.  The confusion she had  initiallty appears to have resolved.       Prior Functioning  Home Living Family/patient expects to be discharged to:: Private  residence Living Arrangements: Alone Available Help at Discharge: Available PRN/intermittently;Family Type of Home: House Home Access: Stairs to enter [employer] of Steps: 2 Entrance Stairs-Rails: Left Home Layout: Bed/bath upstairs;Able to live on main level with bedroom/bathroom Home Equipment: Dan Humphreys - 2 wheels;Cane - single point Additional Comments: Pt states that she uses a cane occasionally.   She reports that she still drives. Prior Function Level of Independence: Independent with assistive device(s) Communication Communication: HOH    Cognition  Cognition Arousal/Alertness: Awake/alert Behavior During Therapy: WFL for tasks assessed/performed Overall Cognitive Status: Within Functional Limits for tasks assessed    Extremity/Trunk Assessment Upper Extremity Assessment Upper Extremity Assessment: Defer to OT evaluation Lower Extremity Assessment Lower Extremity Assessment: Overall WFL for tasks assessed Cervical / Trunk Assessment Cervical / Trunk Assessment: Kyphotic   Balance Balance Balance Assessed: No  End of Session PT - End of Session Equipment Utilized During Treatment: Gait belt Activity Tolerance: Patient tolerated treatment well Patient left: in chair;with call bell/phone within reach Nurse Communication: Mobility status  GP     Konrad Penta 03/14/2013, 10:44 AM

## 2013-03-15 ENCOUNTER — Inpatient Hospital Stay (HOSPITAL_COMMUNITY): Payer: Medicare Other

## 2013-03-15 DIAGNOSIS — N179 Acute kidney failure, unspecified: Secondary | ICD-10-CM

## 2013-03-15 DIAGNOSIS — M6282 Rhabdomyolysis: Secondary | ICD-10-CM | POA: Diagnosis present

## 2013-03-15 DIAGNOSIS — I4891 Unspecified atrial fibrillation: Secondary | ICD-10-CM

## 2013-03-15 DIAGNOSIS — I6350 Cerebral infarction due to unspecified occlusion or stenosis of unspecified cerebral artery: Principal | ICD-10-CM

## 2013-03-15 DIAGNOSIS — I639 Cerebral infarction, unspecified: Secondary | ICD-10-CM

## 2013-03-15 HISTORY — DX: Cerebral infarction, unspecified: I63.9

## 2013-03-15 LAB — COMPREHENSIVE METABOLIC PANEL
AST: 52 U/L — ABNORMAL HIGH (ref 0–37)
Albumin: 3 g/dL — ABNORMAL LOW (ref 3.5–5.2)
BUN: 31 mg/dL — ABNORMAL HIGH (ref 6–23)
Calcium: 8.1 mg/dL — ABNORMAL LOW (ref 8.4–10.5)
Chloride: 109 mEq/L (ref 96–112)
Creatinine, Ser: 1.15 mg/dL — ABNORMAL HIGH (ref 0.50–1.10)
Total Bilirubin: 0.6 mg/dL (ref 0.3–1.2)
Total Protein: 6.1 g/dL (ref 6.0–8.3)

## 2013-03-15 LAB — CK: Total CK: 746 U/L — ABNORMAL HIGH (ref 7–177)

## 2013-03-15 LAB — CBC
HCT: 42.1 % (ref 36.0–46.0)
Hemoglobin: 13.8 g/dL (ref 12.0–15.0)
MCH: 29.9 pg (ref 26.0–34.0)
MCV: 91.3 fL (ref 78.0–100.0)
RBC: 4.61 MIL/uL (ref 3.87–5.11)
WBC: 9.5 10*3/uL (ref 4.0–10.5)

## 2013-03-15 LAB — URINE CULTURE: Culture: NO GROWTH

## 2013-03-15 MED ORDER — METOPROLOL TARTRATE 50 MG PO TABS
50.0000 mg | ORAL_TABLET | Freq: Two times a day (BID) | ORAL | Status: DC
  Administered 2013-03-15 – 2013-03-17 (×5): 50 mg via ORAL
  Filled 2013-03-15 (×5): qty 1

## 2013-03-15 NOTE — Clinical Social Work Note (Signed)
CSW gave bed offers to patient, patient wants [facility] and has been there before but [facility] did not offer bed.  Second choice, [facility], is considering patient.  Patient asked CSW to discuss options w son.  Son says he would like placement at [facility] or at [facility] or [facility] in [place].  CSW faxed to those facilities and will follow up w admissions.  Santa Genera, LCSW Clinical Social Worker [phone]

## 2013-03-15 NOTE — Progress Notes (Signed)
[facility] PROGRESS NOTE  Renee Leblanc AVW:[id number] DOB: [birth year] DOA: 03/13/2013 PCP: Colette Ribas, MD  Assessment/Plan: 1. Acute right posterior cerebral artery territory infarcts: Complete stroke evaluation. Neurology consultation. Continue aspirin. Not a good candidate for Coumadin.  2. Unwitnessed fall at home: Almost certainly secondary to acute stroke. Multiple abrasions noted to extremities. Extremity imaging was negative for fracture the patient currently has good range of movement. Physical therapy is recommended skilled nursing facility. 3. Atrial fibrillation with rapid ventricular response: New diagnosis. Now rate controlled. Increase metoprolol, discontinue Cardizem. 2-D echocardiogram and TSH unremarkable.  Given advanced age and size of stroke do not think the patient's a candidate for warfarin at this point. Aspirin. 4. Acute renal failure: Resolved. Suspect secondary to prolonged immobility on the floor, rhabdomyolysis. Check CK, assess for rhabdomyolysis. Continue IV fluids. Rhabdomyolysis: Treatment as above. 5. Positive troponin, chest pain: Second troponin negative. Likely secondary to rapid ventricular rate. 2-D echocardiogram reassuring. TSH normal. Favor chest pain secondary to atrial fibrillation, regardless management would be medical. No evidence of ACS. 6. Hypokalemia: Repleted. 7. Acute encephalopathy: Resolving. Likely secondary to stroke. Monitor clinically.   Followup neurology consultation, complete stroke evaluation  Urine culture negative, discontinue antibiotics  Continue physical therapy, likely will need skilled nursing facility.  Code Status: Full code  DVT prophylaxis: Lovenox  Family Communication: None present  Disposition Plan: Pending further evaluation   Brendia Sacks, MD  [facility]  Pager [phone] If 7PM-7AM, please contact night-coverage at [email or web address], password [facility] 03/15/2013, 9:24 AM  LOS: 2 days    Clinical Summary: 79 year old woman lives home alone, independent with all ADLs who presented to the emergency department after she was found on the floor for an unknown length of time by her son. The patient reports that she fell but cannot report any further details. She denies passing out. No focal deficits were reported. In emergency department she was noted to be confused had superficial bruising and have new-onset atrial fibrillation. Initial workup was notable for acute renal failure, UTI and patient was referred for admission.   Consultants:  Physical therapy: Skilled nursing facility.  Procedures:  2-D echocardiogram: Left ventricular ejection fraction 50-55%. Could not assess diastolic function. Severely dilated right atrium.  Antibiotics:  Ceftriaxone 7/13 >>  HPI/Subjective: Heart rate has remained controlled on oral medications. MRI revealed stroke. Confused overnight. The patient herself feels well except for some generalized muscle stiffness all extremities. Generalized lower extremity weakness. No nausea or vomiting. Eating fine.  Objective: Filed Vitals:   03/15/13 0130 03/15/13 0200 03/15/13 0300 03/15/13 0730  BP: 145/97 133/82 129/87   Pulse: 84  85   Temp:    97.5 F (36.4 C)  TempSrc:    Axillary  Resp: 24 25 20    Height:      Weight:      SpO2: 98%  97%     Intake/Output Summary (Last 24 hours) at 03/15/13 0924 Last data filed at 03/15/13 0600  Gross per 24 hour  Intake 1095.75 ml  Output   1650 ml  Net -554.25 ml     Filed Weights   03/13/13 1610 03/13/13 2055 03/14/13 0500  Weight: 50.803 kg (112 lb) 49.1 kg (108 lb 3.9 oz) 50 kg (110 lb 3.7 oz)    Exam:   Afebrile. Heart rate 90s in general. Afebrile, vitals stable.  General: Appears calm and comfortable.  Eyes: Pupils equal, round, react to light. Normal lids, irises.  ENT: Hearing grossly normal.  Cardiovascular: Regular  rate and rhythm. No murmur, rub, gallop. No lower  extremity edema.  Respiratory: Clear to auscultation bilaterally. No wheezes, rales, rhonchi. Normal respiratory effort.  Abdomen: Soft, nontender, nondistended.  Musculoskeletal: Grossly normal tone and strength all extremities, generally weak. No focal deficit.  Neurologic: Cranial nerves 2-12 intact. Speech fluent and clear.  Psychiatric: Grossly normal mood and affect. Speech fluent and appropriate. Oriented to self, location. Not month or year.  Data Reviewed:  Excellent urine output  Serum potassium now normal. Kidney function has nearly normalized. Transaminases mildly elevated likely reflective of rhabdomyolysis/hypoperfusion  CK decreased now 746  CBC unremarkable, leukocytosis has resolved  Chest x-ray negative:  MRI brain: Acute infarction right posterior cerebral artery territory affecting medial temporal lobe, occipital lobe and hippocampus on the right. MRA brain: Intracranial atherosclerotic changes, most significant tandem stenosis posterior cerebral arteries more notable in the right.  2-D echocardiogram noted   TSH, hemoglobin A1c normal  Urine culture no growth, final  Pending studies:   Lipid panel  Scheduled Meds: . aspirin EC  81 mg Oral Daily  . cefTRIAXone (ROCEPHIN)  IV  1 g Intravenous Q24H  . diltiazem  30 mg Oral Q6H  . docusate sodium  100 mg Oral BID  . folic acid  1 mg Oral Daily  . gabapentin  100 mg Oral TID  . latanoprost  1 drop Both Eyes QHS  . lidocaine  1 patch Transdermal Q24H  . metoprolol  25 mg Oral BID  . pantoprazole  40 mg Oral Daily  . potassium chloride  40 mEq Oral BID  . sodium chloride  3 mL Intravenous Q12H  . timolol  1 drop Both Eyes q morning - 10a   Continuous Infusions: . sodium chloride 125 mL/hr at 03/15/13 0343  . diltiazem (CARDIZEM) infusion Stopped (03/14/13 2023)    Principal Problem:   Acute ischemic stroke Active Problems:   Sciatica   DDD (degenerative disc disease), lumbar   Fall    Dehydration   Delirium due to general medical condition   Atrial fibrillation   Acute renal failure   Rhabdomyolysis   Time spent 20 minutes

## 2013-03-15 NOTE — Progress Notes (Signed)
Patient has been  encouraged to drink fluids. She states, "I don't want to drink, I'm afraid I will pee in the bed."

## 2013-03-15 NOTE — Progress Notes (Addendum)
Patient is very confused. She is able to be reoriented and accepts that she is in the hospital but within a few minutes she forgets and thinks she is at home. TED hose were unable to be applied due to her bruised legs, but SCDs are on and tolerated well.

## 2013-03-15 NOTE — Progress Notes (Signed)
Physical Therapy Treatment Patient Details Name: Renee Leblanc MRN: [id number] DOB: 1917-03-17 Today's Date: 03/15/2013 Time: 4098-1191 PT Time Calculation (min): 25 min Charges Gt 1; There ex 1 PT Assessment / Plan / Recommendation  PT Comments   I just don't want to fall  Follow Up Recommendations  SNF     Does the patient have the potential to tolerate intense rehabilitation   no  Barriers to Discharge  lives alone      Equipment Recommendations  None recommended by PT    Recommendations for Other Services  SNF     Progress towards PT Goals Progress towards PT goals: Progressing toward goals  Plan Current plan remains appropriate    Precautions / Restrictions Precautions Precautions: None Restrictions Weight Bearing Restrictions: No   Pertinent Vitals/Pain 3/10 lateral aspect of Lt LE    Mobility  Bed Mobility Bed Mobility: Sit to Supine Supine to Sit: 4: Min assist Sit to Supine: 4: Min assist Transfers Sit to Stand: 4: Min assist Stand to Sit: 5: Supervision Ambulation/Gait Ambulation/Gait Assistance: 5: Supervision Ambulation Distance (Feet): 130 Feet Assistive device: Rolling walker Gait velocity: Pt velocity has iimproved now slow not very slow and labored General Gait Details: Pt demonstrates no difficultuy raising her Rt foot off the floor as she did yesterday Stairs: No    Exercises General Exercises - Upper Extremity Shoulder Flexion: PROM;AROM;Both;10 reps;Seated General Exercises - Lower Extremity Ankle Circles/Pumps: AROM;Both;10 reps Heel Slides: Both;10 reps Hip ABduction/ADduction: Both;10 reps Straight Leg Raises: Both;5 reps Other Exercises Other Exercises: shoulder rolls, shoulder elevation, scapular protraction, BUE, 10 reps, seated   PT Diagnosis:   decreased mobility;  PT Problem List:  CVA PT Treatment Interventions:   GT, exercise  PT Goals (current goals can now be found in the care plan section) Acute Rehab PT  Goals Patient Stated Goal: pt is hoping to return home with full independence PT Goal Formulation: With patient Time For Goal Achievement: 03/28/13 Potential to Achieve Goals: Good  Visit Information  Last PT Received On: 03/15/13 Assistance Needed: +1 History of Present Illness: Pt is a 79 year old who was apparently independent at home and was found by her son, facedown on the floor at her home.  She is admitted with UTI, dehydration and Afib.  She is currently on cardizem drip.  The confusion she had initiallty appears to have resolved.    Subjective Data  Patient Stated Goal: pt is hoping to return home with full independence   Cognition  Cognition Arousal/Alertness: Awake/alert Behavior During Therapy: WFL for tasks assessed/performed Overall Cognitive Status: Impaired/Different from baseline Area of Impairment: Orientation;Awareness;Problem solving Orientation Level: Disoriented to;Situation Problem Solving: Requires verbal cues;Requires tactile cues       End of Session PT - End of Session Equipment Utilized During Treatment: Gait belt Activity Tolerance: Patient tolerated treatment well Patient left: in bed;with call bell/phone within reach   GP     Meekah Math,CINDY 03/15/2013, 4:20 PM

## 2013-03-15 NOTE — Care Management Note (Addendum)
    Page 1 of 1   03/17/2013     3:37:56 PM   CARE MANAGEMENT NOTE 03/17/2013  Patient:  Renee Leblanc, Renee Leblanc   Account Number:  [id number]  Date Initiated:  03/15/2013  Documentation initiated by:  Sharrie Rothman  Subjective/Objective Assessment:   Pt admitted from home with altered mental status. Pt lives alone but has a son that calls nightly but comes weekly to visit. Pt will need higher level of care at discharge. PT recommends SNF at discharge.     Action/Plan:   CSW is aware and has started a bed search. No CM needs noted at this time.   Anticipated DC Date:  03/17/2013   Anticipated DC Plan:  SKILLED NURSING FACILITY  In-house referral  Clinical Social Worker      DC Planning Services  CM consult      Choice offered to / List presented to:             Status of service:  Completed, signed off Medicare Important Message given?   (If response is "NO", the following Medicare IM given date fields will be blank) Date Medicare IM given:   Date Additional Medicare IM given:    Discharge Disposition:    Per UR Regulation:    If discussed at Long Length of Stay Meetings, dates discussed:    Comments:  03/17/13 1300 Anibal Henderson RN/CM D/C to [facility]  03/15/13 1030 Arlyss Queen, Charity fundraiser BSN CM

## 2013-03-15 NOTE — Evaluation (Signed)
Speech Language Pathology Evaluation Patient Details Name: Renee Leblanc MRN: [id number] DOB: [birth year] Today's Date: 03/15/2013 Time: 8413-2440 SLP Time Calculation (min): 48 min  Problem List:  Patient Active Problem List   Diagnosis Date Noted  . Acute ischemic stroke 03/15/2013  . Rhabdomyolysis 03/15/2013  . Atrial fibrillation 03/14/2013  . Acute renal failure 03/14/2013  . Fall 03/13/2013  . Dehydration 03/13/2013  . Delirium due to general medical condition 03/13/2013  . Radicular leg pain 09/02/2012  . DDD (degenerative disc disease), lumbar 06/24/2012  . Rotator cuff arthropathy 05/19/2012  . Weight loss 12/02/2010  . Weight loss, unintentional 12/02/2010  . HIP PAIN 09/20/2009  . Sciatica 09/20/2009  . KNEE, ARTHRITIS, DEGEN./OSTEO 09/07/2008  . BACK PAIN 09/22/2007  . KNEE PAIN, CHRONIC 07/01/2007  . ANSERINE BURSITIS, RIGHT 07/01/2007   Past Medical History:  Past Medical History  Diagnosis Date  . Chronic back pain     3 yrs  . Knee pain, right   . Pernicious anemia   . Overactive bladder   . Diverticulosis of colon 09/20/2003    colonoscopy Dr Jena Gauss , inflammatory polyps  . Hemorrhoids   . Acute angle-closure glaucoma   . Hypertension   . GERD (gastroesophageal reflux disease)   . Osteoporosis   . Tubular adenoma of colon 12/18/10    Last colonosocpy Dr Jena Gauss 2 TA removed, anal papillae, internal hemorrhoids  . Hiatal hernia 12/18/10    On last EGD Dr Elmer Ramp otherwise   Past Surgical History:  Past Surgical History  Procedure Laterality Date  . Appendectomy    . Total abdominal hysterectomy    . Tonsillectomy    . Hip fracture surgery     HPI:  Renee Leblanc is a 79 y.o. female brought by EMS to the Emergency Department complaining of an unwitnessed fall that occurred pta, with subsequent injuries to multiple areas.  Pt's son reports that he went to pt's house and became concerned when she would not answer her door.  He called Public house manager, who broke a window in order to help son enter the house.  He states that they found pt laying face down coming out of her bathroom and he notes that there was food cooking on her stove and the stove was still on.  When he spoke to her he notes that she was disoriented and thought that she was standing at her sink while she was still lying on the floor.  When EMS arrived they noted multiple abrasions and swelling to the hips and knees. MRI shows acute right stroke.   Assessment / Plan / Recommendation Clinical Impression  Mild/mod cogntive impairment characterized by decreased working, short term, and prospective memory, disorientation. Orientation seems to fluctuate, however she is easily re-oriented and redirected. Recommend skilled cognitive-linguistic therapy to address deficits and improve safety/awareness- all needs can be met at next venue of care. Recommend skilled nursing/rehab.    SLP Assessment  Patient needs continued Speech Lanaguage Pathology Services    Follow Up Recommendations  Skilled Nursing facility    Frequency and Duration min 2x/week  1 week   Pertinent Vitals/Pain    SLP Goals  SLP Goals Potential to Achieve Goals: Good Potential Considerations: Ability to learn/carryover information Progress/Goals/Alternative treatment plan discussed with pt/caregiver and they: Agree SLP Goal #1: Pt will demonstrate use of call bell for safety/needs with mod cues from staff. SLP Goal #2: Pt will be oriented to place and time when provided binary choices with 90% acc.  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[place])   Cognition  Overall Cognitive Status: Impaired/Different from baseline Arousal/Alertness: Awake/alert Orientation Level: Oriented to person;Oriented to place;Disoriented to time;Oriented to situation Memory:  Impaired Memory Impairment: Storage deficit;Retrieval deficit;Prospective memory Awareness: Impaired Awareness Impairment: Intellectual impairment Problem Solving: Appears intact Executive Function: Self Monitoring Self Monitoring: Impaired Self Monitoring Impairment: Verbal complex Safety/Judgment: Impaired (due to decreased awareness of memory deficits)    Comprehension  Auditory Comprehension Overall Auditory Comprehension: Appears within functional limits for tasks assessed Yes/No Questions: Within Functional Limits Commands: Within Functional Limits Conversation: Simple Interfering Components: Hearing;Processing speed;Working Radio broadcast assistant: Extra processing time;Increased volume;Pausing;Stressing words Visual Recognition/Discrimination Discrimination: Within Function Limits Reading Comprehension Reading Status: Impaired Paragraph Level: Impaired Functional Environmental (signs, name badge): Within functional limits Interfering Components: Working memory    Expression Expression Primary Mode of Expression: Verbal Verbal Expression Overall Verbal Expression: Appears within functional limits for tasks assessed Initiation: No impairment Level of Generative/Spontaneous Verbalization: Conversation Repetition: No impairment Naming: Impairment Confrontation: Impaired Verbal Errors: Aware of errors Pragmatics: No impairment Effective Techniques: Open ended questions;Phonemic cues Non-Verbal Means of Communication: Not applicable Written Expression Dominant Hand: Right Written Expression: Not tested   Oral / Motor Oral Motor/Sensory Function Overall Oral Motor/Sensory Function: Appears within functional limits for tasks assessed (decreased sensation Left labial) Motor Speech Respiration: Within functional limits Phonation: Normal Resonance: Within functional limits Articulation: Within functional limitis Intelligibility: Intelligibility reduced (mild  dysarthria) Conversation: 75-100% accurate   Thank you,  Havery Moros, CCC-SLP [phone]       [name] 03/15/2013, 7:32 PM

## 2013-03-15 NOTE — Evaluation (Signed)
Occupational Therapy Evaluation Patient Details Name: Renee Leblanc MRN: [id number] DOB: 28-Aug-1917 Today's Date: 03/15/2013 Time: 0930-1005 OT Time Calculation (min): 35 min  OT Assessment / Plan / Recommendation History of present illness Pt is a 79 year old who was apparently independent at home and was found by her son, facedown on the floor at her home.  She is admitted with UTI, dehydration and Afib.  She is currently on cardizem drip.  The confusion she had initiallty appears to have resolved.   Clinical Impression   Patient is a 79 y/o female s/p CVA presenting to acute OT with deficits below. Patient with benefit from OT services to increase ADL performance, functional transfers, BUE strength and endurance. Recommend SNF at d/c.    OT Assessment  Patient needs continued OT Services    Follow Up Recommendations  SNF    Barriers to Discharge Decreased caregiver support Patient lives alone  Equipment Recommendations  None recommended by OT       Frequency  Min 2X/week    Precautions / Restrictions Precautions Precautions: Fall   Pertinent Vitals/Pain No complaints.     ADL  Lower Body Dressing: Performed;Minimal assistance Where Assessed - Lower Body Dressing: Unsupported sitting Toilet Transfer: Performed;Minimal assistance Toilet Transfer Method: Stand pivot Toilet Transfer Equipment: Regular height toilet Toileting - Clothing Manipulation and Hygiene: Performed;Maximal assistance Where Assessed - Toileting Clothing Manipulation and Hygiene: Standing Transfers/Ambulation Related to ADLs: Patient transfers with Min Assist with RW. vc's for hand placement.     OT Diagnosis: Generalized weakness  OT Problem List: Decreased strength;Decreased range of motion;Decreased safety awareness;Impaired balance (sitting and/or standing);Decreased knowledge of use of DME or AE OT Treatment Interventions: Self-care/ADL training;Therapeutic activities;Therapeutic  exercise;Patient/family education;Balance training;Modalities;Manual therapy   OT Goals(Current goals can be found in the care plan section)    Visit Information  Last OT Received On: 03/15/13 Assistance Needed: +1 History of Present Illness: Pt is a 79 year old who was apparently independent at home and was found by her son, facedown on the floor at her home.  She is admitted with UTI, dehydration and Afib.  She is currently on cardizem drip.  The confusion she had initiallty appears to have resolved.       Prior Functioning     Home Living Family/patient expects to be discharged to:: Private residence Living Arrangements: Alone Available Help at Discharge: Available PRN/intermittently;Family Type of Home: House Home Access: Stairs to enter [employer] of Steps: 2 Home Layout: Bed/bath upstairs;Able to live on main level with bedroom/bathroom Home Equipment: Dan Humphreys - 2 wheels;Cane - single point Additional Comments: Pt states that she uses a cane occasionally.   She reports that she still drives. Prior Function Level of Independence: Independent with assistive device(s) Communication Communication: HOH Dominant Hand: Right         Vision/Perception Vision - History Baseline Vision: No visual deficits   Cognition  Cognition Arousal/Alertness: Awake/alert Behavior During Therapy: WFL for tasks assessed/performed Overall Cognitive Status: Impaired/Different from baseline Area of Impairment: Orientation;Awareness;Problem solving Orientation Level: Disoriented to;Situation Problem Solving: Requires verbal cues;Requires tactile cues    Extremity/Trunk Assessment Upper Extremity Assessment Upper Extremity Assessment: RUE deficits/detail;LUE deficits/detail RUE Deficits / Details: PROM WFL in all ranges. AROM Shoulder flexion to approx. 90 degrees, Elbow, wrist and hand ranges WNL. MMT: shoulder flexion: 2+/5. Elbow flexion/extension: 3+/5. gross grasp decreased  strength LUE Deficits / Details: PROM WFL in all ranges. AROM Shoulder flexion to approx. 90 degrees, Elbow, wrist and hand ranges  WNL. MMT: shoulder flexion: 2+/5. Elbow flexion/extension: 3+/5. gross grasp decreased strength     Mobility Transfers Sit to Stand: 4: Min assist;With upper extremity assist;From bed Stand to Sit: 5: Supervision;To chair/3-in-1;With upper extremity assist     Exercise General Exercises - Upper Extremity Shoulder Flexion: PROM;AROM;Both;10 reps;Seated Other Exercises Other Exercises: shoulder rolls, shoulder elevation, scapular protraction, BUE, 10 reps, seated      End of Session OT - End of Session Equipment Utilized During Treatment: Rolling walker Activity Tolerance: Patient tolerated treatment well Patient left: in chair;with call bell/phone within reach;with nursing/sitter in room Nurse Communication: Mobility status    Limmie Patricia, OTR/L,CBIS   03/15/2013, 1:12 PM

## 2013-03-15 NOTE — Clinical Social Work Note (Signed)
Son informed of tentative bed offer by [facility] in [place].  Son will contact facility and speak w patient when he visits this afternoon.    Santa Genera, LCSW Clinical Social Worker [phone])

## 2013-03-16 ENCOUNTER — Encounter: Payer: Self-pay | Admitting: Internal Medicine

## 2013-03-16 ENCOUNTER — Encounter (HOSPITAL_COMMUNITY): Payer: Self-pay | Admitting: Internal Medicine

## 2013-03-16 DIAGNOSIS — I10 Essential (primary) hypertension: Secondary | ICD-10-CM

## 2013-03-16 HISTORY — DX: Essential (primary) hypertension: I10

## 2013-03-16 LAB — LIPID PANEL
HDL: 47 mg/dL (ref >39)
LDL Cholesterol: 63 mg/dL (ref 0–99)
Total CHOL/HDL Ratio: 2.8 RATIO
Triglycerides: 114 mg/dL (ref <150)

## 2013-03-16 MED ORDER — IRBESARTAN 75 MG PO TABS
75.0000 mg | ORAL_TABLET | Freq: Every day | ORAL | Status: DC
  Administered 2013-03-16: 75 mg via ORAL
  Filled 2013-03-16: qty 1

## 2013-03-16 MED ORDER — AMLODIPINE BESYLATE 5 MG PO TABS: 5.0000 mg | ORAL_TABLET | Freq: Every day | ORAL | Status: DC

## 2013-03-16 NOTE — Progress Notes (Addendum)
[facility] PROGRESS NOTE  Renee Leblanc WUJ:[id number] DOB: 25-Oct-1916 DOA: 03/13/2013 PCP: Colette Ribas, MD    Code Status: Full code Family Communication: No family members present. Disposition Plan: Plan to discharge to skilled nursing facility when medically stable.   Consultants:  Neurology, pending.  Procedures: 2-D echocardiogram:Study Conclusions  - Left ventricle: There was moderate concentric hypertrophy. Systolic function was normal. The estimated ejection fraction was in the range of 50% to 55%. The study is not technically sufficient to allow evaluation of LV diastolic function, due to underlying atrial fibrillation. - Aortic valve: Trivial regurgitation. - Mitral valve: Moderately thickened leaflets, without stenosis. - Left atrium: The atrium was mild to moderately dilated. - Right atrium: Severely dilated. - Tricuspid valve: Moderate regurgitation. Transthoracic echocardiography. M-mode, complete 2D, spectral Doppler, and color Doppler. Height: Height: 165.1cm. Height: 65in. Weight: Weight: 49.9kg. Weight: 109.8lb. Body mass index: BMI: 18.3kg/m^2. Body surface area: BSA: 1.27m^2. Patient status: Inpatient. Location: ICU/CCU    Antibiotics:  Ceftriaxone 03/13/2013>>> 03/15/2013  HPI/Subjective: The patient has no complaints of chest pain, palpitations, or shortness of breath. She has no difficulty speaking or chewing/swallowing her foods.  Objective: Filed Vitals:   03/16/13 0500 03/16/13 0600 03/16/13 0700 03/16/13 0800  BP:  172/104  162/105  Pulse:      Temp:      TempSrc:      Resp: 21 21 20 22   Height:      Weight:      SpO2:        Intake/Output Summary (Last 24 hours) at 03/16/13 0932 Last data filed at 03/15/13 1814  Gross per 24 hour  Intake   2320 ml  Output      0 ml  Net   2320 ml   Filed Weights   03/13/13 2055 03/14/13 0500 03/16/13 0454  Weight: 49.1 kg (108 lb 3.9 oz) 50 kg (110 lb 3.7 oz) 53.4 kg (117  lb 11.6 oz)    Exam:   General:  Pleasant alert elderly 79 year old woman sitting up in bed eating breakfast. No acute distress.  Cardiovascular: S1, S2 with ectopy versus irregular, irregular.  Respiratory: Clear anteriorly with decreased breath sounds in the bases. Breathing is nonlabored.  Abdomen: Positive bowel sounds, soft, nontender, nondistended.  Musculoskeletal: Mild arthritic changes noted in her hands and her knees. No acute hot red joints. No pedal edema. Pedal pulses palpable.  Neurologic: She is alert and oriented x2. She has minimal dysarthria and minimal left facial droop. Strength of her right upper extremity and right lower extremity is 5 over 5. Strength of her left upper extremity and left lower extremity is 4 minus over 5.  Data Reviewed: Basic Metabolic Panel:  Recent Labs Lab 03/13/13 1608 03/14/13 0207 03/15/13 0514  NA 143 142 139  K 3.5 2.7* 4.8  CL 101 101 109  CO2 30 28 20   GLUCOSE 147* 128* 87  BUN 30* 32* 31*  CREATININE 1.65* 1.40* 1.15*  CALCIUM 10.1 9.1 8.1*   Liver Function Tests:  Recent Labs Lab 03/13/13 1608 03/14/13 0207 03/15/13 0514  AST 46* 48* 52*  ALT 31 29 38*  ALKPHOS 92 78 68  BILITOT 1.2 0.6 0.6  PROT 8.0 7.2 6.1  ALBUMIN 4.0 3.5 3.0*   No results found for this basename: LIPASE, AMYLASE,  in the last 168 hours No results found for this basename: AMMONIA,  in the last 168 hours CBC:  Recent Labs Lab 03/13/13 1608 03/14/13 0207 03/15/13 0514  WBC  15.2* 13.1* 9.5  NEUTROABS 12.8*  --   --   HGB 17.4* 15.7* 13.8  HCT 51.8* 47.1* 42.1  MCV 89.6 89.2 91.3  PLT 174 188 162   Cardiac Enzymes:  Recent Labs Lab 03/13/13 1608 03/14/13 0207 03/14/13 0913 03/14/13 1949 03/15/13 0514  CKTOTAL 1147*  --  1262*  --  746*  TROPONINI <0.30 0.41*  --  <0.30  --    BNP (last 3 results) No results found for this basename: PROBNP,  in the last 8760 hours CBG: No results found for this basename: GLUCAP,  in the  last 168 hours  Recent Results (from the past 240 hour(s))  URINE CULTURE     Status: None   Collection Time    03/13/13  5:48 PM      Result Value Range Status   Specimen Description URINE, CATHETERIZED   Final   Special Requests NONE   Final   Culture  Setup Time 03/13/2013 18:31   Final   Colony Count NO GROWTH   Final   Culture NO GROWTH   Final   Report Status 03/15/2013 FINAL   Final  MRSA PCR SCREENING     Status: None   Collection Time    03/13/13 10:15 PM      Result Value Range Status   MRSA by PCR NEGATIVE  NEGATIVE Final   Comment:            The GeneXpert MRSA Assay (FDA     approved for NASAL specimens     only), is one component of a     comprehensive MRSA colonization     surveillance program. It is not     intended to diagnose MRSA     infection nor to guide or     monitor treatment for     MRSA infections.     Studies: Mr Shirlee Latch ZO Contrast  03/15/2013   *RADIOLOGY REPORT*  Clinical Data: Acute right posterior cerebral artery distribution infarct.  MRA HEAD WITHOUT CONTRAST  Technique: Angiographic images of the Circle of Willis were obtained using MRA technique without intravenous contrast.  Comparison: 03/14/2013 brain MR.  Findings: Right internal carotid artery cavernous and supraclinoid segment regions of mild moderate narrowing and ectasia.  Left internal carotid artery slightly smaller than the right which may be related to the proximal stenosis.  Left internal carotid artery cavernous segment regions of mild to moderate narrowing.  Loss of signal distal M1 segment right middle cerebral artery and adjacent right middle cerebral artery bifurcation partially technical in origin although there may be underlying mild moderate stenosis.  Mild to moderate focal narrowing distal M1 segment left middle cerebral artery.  Middle cerebral artery moderate branch vessel irregularity bilaterally.  Moderate narrowing A1 segment left anterior cerebral artery.  Artifact  extends to the distal vertebral arteries.  Mild to moderate narrowing of the vertebral arteries.  Moderate to marked narrowing left PICA.  Marked narrowing and poor delineation right PICA.  Ectatic basilar artery with mild narrowing and  irregularity.  Moderate narrowing right AICA.  Mild narrowing left AICA.  Moderate narrowing superior cerebral artery bilaterally.  Marked focal stenosis proximal right posterior cerebral artery with marked narrowing and irregularity beyond this region with poor delineation of a majority of the right posterior cerebral artery.  Marked tandem stenosis of the left posterior cerebral artery.  No aneurysm noted.  IMPRESSION:  Intracranial atherosclerotic type changes as detailed above. Most significant abnormality is the marked tandem  stenosis involving the posterior cerebral arteries more notable on the right.   Original Report Authenticated By: Lacy Duverney, M.D.   Mr Brain Wo Contrast  03/14/2013   *RADIOLOGY REPORT*  Clinical Data: Altered mental status.  MRI HEAD WITHOUT CONTRAST  Technique:  Multiplanar, multiecho pulse sequences of the brain and surrounding structures were obtained according to standard protocol without intravenous contrast.  Comparison: Head CT 03/13/2013  Findings: No acute infarction of the brainstem or cerebellum.  There are areas of infarction in the right posterior cerebral artery territory.  There is a 2 cm acute infarction at the anterior medial temporal lobe on the right.  This contains some petechial blood products.  There is a 2 cm infarction in the right occipital cortex which appears bland.  There is a punctate acute infarction along the hippocampus adjacent to the atrium of the right lateral ventricle.  There are no other acute infarctions.  I think there are a few artifactual punctate foci of diffusion signal in the cerebral hemispheres that do not reflect true disease.  There are old or chronic small vessel infarctions affecting the thalami,  basal ganglia and hemispheric deep white matter.  No mass lesion. No hydrocephalus or extra-axial collection.  No pituitary mass. Small amount of fluid in the left maxillary sinus.  Major vessels at the base of the brain show flow.  IMPRESSION: Areas of acute infarction in the right posterior cerebral artery territory affecting the antrum medial temporal lobe, occipital lobe and hippocampus on the right.  There are some petechial blood products associated with the anterior temporal infarction.  Extensive chronic small vessel changes elsewhere throughout the brain.  Because of the findings, I discussed this case with Dr. Orvan Falconer at 19:35 hours.   Original Report Authenticated By: Paulina Fusi, M.D.   US Carotid Duplex Bilateral  03/15/2013   *RADIOLOGY REPORT*  Clinical Data: Multifocal cerebral infarction. History of hypertension.  BILATERAL CAROTID DUPLEX ULTRASOUND  Technique: Wallace Cullens scale imaging, color Doppler and duplex ultrasound were performed of bilateral carotid and vertebral arteries in the neck.  Comparison:  None.  Criteria:  Quantification of carotid stenosis is based on velocity parameters that correlate the residual internal carotid diameter with NASCET-based stenosis levels, using the diameter of the distal internal carotid lumen as the denominator for stenosis measurement.  The following velocity measurements were obtained:                   PEAK SYSTOLIC/END DIASTOLIC RIGHT ICA:                        63/19cm/sec CCA:                        69/11cm/sec SYSTOLIC ICA/CCA RATIO:     0.9 DIASTOLIC ICA/CCA RATIO:    1.8 ECA:                        77cm/sec  LEFT ICA:                        57/19cm/sec CCA:                        77/14cm/sec SYSTOLIC ICA/CCA RATIO:     0.7 DIASTOLIC ICA/CCA RATIO:    1.3 ECA:  44cm/sec  Findings:  RIGHT CAROTID ARTERY: The common carotid artery is very tortuous in the neck.  A relatively mild amount of mixed plaque is noted at the level of the  carotid bulb and proximal ICA.  Estimated right ICA stenosis is less than 50%.  RIGHT VERTEBRAL ARTERY:  Antegrade flow with normal wave form.  LEFT CAROTID ARTERY: Relatively mild amount of calcified and noncalcified plaque is noted at the level of the carotid bulb and proximal ICA.  Estimated left ICA stenosis is less than 50%.  The left ICA is tortuous.  LEFT VERTEBRAL ARTERY:  Antegrade flow with normal wave form.  IMPRESSION: Relatively mild amount of atherosclerotic plaque at both carotid bifurcations.  Estimated bilateral ICA stenoses are less than 50% in the neck.   Original Report Authenticated By: Irish Lack, M.D.    Scheduled Meds: . aspirin EC  81 mg Oral Daily  . docusate sodium  100 mg Oral BID  . folic acid  1 mg Oral Daily  . gabapentin  100 mg Oral TID  . latanoprost  1 drop Both Eyes QHS  . lidocaine  1 patch Transdermal Q24H  . metoprolol  50 mg Oral BID  . pantoprazole  40 mg Oral Daily  . potassium chloride  40 mEq Oral BID  . sodium chloride  3 mL Intravenous Q12H  . timolol  1 drop Both Eyes q morning - 10a   Continuous Infusions: . sodium chloride 50 mL/hr at 03/16/13 0545    Assessment:  Principal Problem:   Acute ischemic stroke Active Problems:   Sciatica   DDD (degenerative disc disease), lumbar   Fall   Dehydration   Delirium due to general medical condition   Atrial fibrillation   Acute renal failure   Rhabdomyolysis   Essential hypertension, malignant   1. Acute ischemic/query embolic strokes on the right. Will continue aspirin. Agree that the patient is not a good candidate for Coumadin given her age and propensity for falls. Speech therapy, occupational therapy, and physical therapy recommendations noted.   Atrial fibrillation with rapid ventricular response. This is a new diagnosis. Rate is controlled. We'll continue metoprolol. Continue antiplatelet therapy. 2-D echocardiogram results noted.   Malignant hypertension. We'll continue  metoprolol. We'll restart Avapro at a lower dose for better control.  Elevated troponin I x1. Likely secondary to rapid ventricular rate. Doubt acute coronary syndrome. Continue beta blocker and antiplatelet.  Unwitnessed fall at home. Likely secondary to acute stroke. Imaging reveals no fractures.  Acute renal failure. This is resolving with IV fluids.  Mild rhabdomyolysis. This is resolving with IV fluids.  Hypokalemia. Repleted.  Acute encephalopathy/delirium. Appears to be resolved.  Query urinary tract infection. Ceftriaxone given but discontinued when the urine culture proved to be negative.   Plan: 1. Restart Avapro at a smaller dose and follow renal function.  2. Transfer out of the step down unit/ICU. 3. Continue therapies as ordered. 4. Plan discharge the skilled nursing facility tomorrow.     Time spent: 35 minutes     [facility]  [facility] Pager [phone] . If 7PM-7AM, please contact night-coverage at [email or web address], password [facility] 03/16/2013, 9:32 AM  LOS: 3 days

## 2013-03-16 NOTE — Progress Notes (Addendum)
Physical Therapy Treatment Patient Details Name: Renee Leblanc MRN: [id number] DOB: [birth year] Today's Date: 03/16/2013 Time: 8295-6213 PT Time Calculation (min): 22 min  PT Assessment / Plan / Recommendation  PT Comments   Pt alert and willing to participate following education on benefits of completing therapy.  Pt complained of pain Lt hand where IV inserts and unwilling to ambulate further because of pain with hand placement on walker.  Gait training with RW min guard x 20 feet with cueing for positioning inside walker and safe mechanics with sit to stand.  Pt left in chair with call bell within reach and visitors present.  Follow Up Recommendations        Does the patient have the potential to tolerate intense rehabilitation     Barriers to Discharge        Equipment Recommendations       Recommendations for Other Services    Frequency     Progress towards PT Goals Progress towards PT goals: Progressing toward goals  Plan      Precautions / Restrictions     Pertinent Vitals/Pain Pt stated left hand pain scale 25/10.    Mobility  Bed Mobility Bed Mobility: Supine to Sit Supine to Sit: 4: Min assist Details for Bed Mobility Assistance: transfer was very labored from supine to sit Transfers Transfers: Sit to Stand;Stand to Sit Sit to Stand: 4: Min assist Stand to Sit: 5: Supervision Details for Transfer Assistance: Pt with constant cueing for handplacement with bed mobility, transfers and gait Ambulation/Gait Ambulation/Gait Assistance: 5: Supervision Ambulation Distance (Feet): 20 Feet Assistive device: Rolling walker Gait Pattern: Decreased step length - left;Decreased stance time - left;Decreased hip/knee flexion - left;Trunk flexed Gait velocity: slow and labored General Gait Details: Decreased gait distance, pt with constant compliants of pain Lt hand and unwilling to ambulate further because of pain Stairs: No Wheelchair Mobility Wheelchair Mobility: No     Exercises     PT Diagnosis:    PT Problem List:   PT Treatment Interventions:     PT Goals (current goals can now be found in the care plan section)    Visit Information  Last PT Received On: 03/16/13    Subjective Data      Cognition  Cognition Arousal/Alertness: Awake/alert Behavior During Therapy: [facility] for tasks assessed/performed Overall Cognitive Status: Impaired/Different from baseline Area of Impairment: Orientation;Awareness;Problem solving Orientation Level: Disoriented to;Situation Problem Solving: Requires verbal cues;Requires tactile cues General Comments: Pt not aware of where she is at    Balance     End of Session PT - End of Session Equipment Utilized During Treatment: Gait belt Activity Tolerance: Patient tolerated treatment well Patient left: in chair;with call bell/phone within reach;with family/visitor present Nurse Communication: Mobility status   GP     Juel Burrow 03/16/2013, 12:05 PM

## 2013-03-16 NOTE — Consult Note (Addendum)
[facility] Renee Giarratano A. Gerilyn Pilgrim, MD     [email or web address]          Renee Leblanc is an 79 y.o. female.   ASSESSMENT/PLAN: Acute right PCA infarct presenting with confusion and loss of  consciousness. The patient has significant residual confusion, which is  likely due to her stroke. She also had extensive chronic ischemic white  matter changes and is at increased risk of developing vascular dementia,  especially with the acute infarct. It may also be an issue with  sundowning. Recommendations are antiplatelet agents with aspirin. It  is suggested also the patient should be on a statin medication.  Consider dementia labs such as thyroid function tests and vitamin B12  level and homocysteine level.    The patient is a 79 year old white female, who is very active. She  lives independently, drives, and does her ADLs. The son did not see her  for a day and sent police to check the situation out. The patient was found unresponsive on the floor, confused. She did sustain  some superficial bruises of the lower extremities. She has been worked  up and the evaluation shows an acute PCA infarct on the right side. The  patient has improved, but remains confused and disoriented.  REVIEW OF SYSTEMS: Limited, but she complains of pain involving the  extremities. She reports weakness of the extremities from both sides.  She does not complain of dizziness, chest pain, shortness of breath,  GU/GI symptoms, or headaches.  PHYSICAL EXAMINATION: GENERAL: A thin pleasant lady, in no acute  distress.  HEENT: Head is normocephalic, atraumatic.  NECK: Supple.  ABDOMEN: Soft.  EXTREMITIES: There are few superficial bruises involving the legs.  NEUROLOGIC: Mentation: She is awake and alert. She is disoriented,  however, she thinks she is at home and she asks repeatedly how come we  got into her house. I did try to reorient her, but she still remains  confused. She speaks in full clear  sentences, however, cranial nerve  evaluation, pupils are 4 mm and reactive. Visual fields are intact.  Extraocular movements are full. Facial and muscle strength is  symmetric. Tongue is midline. Motor  examination shows diffuse weakness throughout 4-/5 in both the upper and  lower extremities. No clear drift. Bulk and tone are normal  throughout. Coordination shows no dysmetria or tremors. Reflexes are  preserved throughout. Sensation is unreliable, but normal to light  touch. She does not extinguish to double simultaneous stimulation.       Past Medical History  Diagnosis Date  . Chronic back pain     3 yrs  . Knee pain, right   . Pernicious anemia   . Overactive bladder   . Diverticulosis of colon 09/20/2003    colonoscopy Dr Jena Gauss , inflammatory polyps  . Hemorrhoids   . Acute angle-closure glaucoma   . Hypertension   . GERD (gastroesophageal reflux disease)   . Osteoporosis   . Tubular adenoma of colon 12/18/10    Last colonosocpy Dr Jena Gauss 2 TA removed, anal papillae, internal hemorrhoids  . Hiatal hernia 12/18/10    On last EGD Dr Elmer Ramp otherwise  . Essential hypertension, malignant 03/16/2013    Past Surgical History  Procedure Laterality Date  . Appendectomy    . Total abdominal hysterectomy    . Tonsillectomy    . Hip fracture surgery      History reviewed. No pertinent family history.  Social History:  reports that she has never smoked. She has  never used smokeless tobacco. She reports that  drinks alcohol. She reports that she does not use illicit drugs.  Allergies: No Known Allergies  Medications: Prior to Admission medications   Medication Sig Start Date End Date Taking? Authorizing Provider  Bisoprolol-Hydrochlorothiazide (ZIAC PO) Take by mouth.     Yes Historical Provider, MD  calcium carbonate 200 MG capsule Take 250 mg by mouth daily.     Yes Historical Provider, MD  docusate sodium (COLACE) 100 MG capsule Take 100 mg by mouth daily.     Yes  Historical Provider, MD  folic acid (FOLVITE) 1 MG tablet Take 1 mg by mouth daily.     Yes Historical Provider, MD  furosemide (LASIX) 20 MG tablet Take 20 mg by mouth daily.     Yes Historical Provider, MD  gabapentin (NEURONTIN) 100 MG capsule Take 1 capsule (100 mg total) by mouth 3 (three) times daily. 09/02/12  Yes Vickki Hearing, MD  irbesartan (AVAPRO) 300 MG tablet Take 300 mg by mouth at bedtime.     Yes Historical Provider, MD  latanoprost (XALATAN) 0.005 % ophthalmic solution Place 1 drop into both eyes at bedtime.     Yes Historical Provider, MD  lidocaine (LIDODERM) 5 % Place 1 patch onto the skin daily. Remove & Discard patch within 12 hours or as directed by MD  12/01/10  Yes Historical Provider, MD  metoprolol (LOPRESSOR) 50 MG tablet Take 0.5 tablets (25 mg total) by mouth 2 (two) times daily. 02/03/13  Yes Mihai Croitoru, MD  nabumetone (RELAFEN) 500 MG tablet Take 500 mg by mouth 2 (two) times daily.     Yes Historical Provider, MD  omeprazole (PRILOSEC) 20 MG capsule Take 20 mg by mouth daily.     Yes Historical Provider, MD  oxyCODONE-acetaminophen (TYLOX) 5-500 MG per capsule Take 1 capsule by mouth every 4 (four) hours as needed.     Yes Historical Provider, MD  potassium chloride SA (K-DUR,KLOR-CON) 20 MEQ tablet Take 20 mEq by mouth daily.     Yes Historical Provider, MD  Timolol Maleate (TIMOPTIC OP) Apply to eye.     Yes Historical Provider, MD  Vitamin B1-B12 5.5-0.0125 MG/5ML SOLN Inject 1 Syringe as directed every 30 (thirty) days.     Yes Historical Provider, MD    Scheduled Meds: . aspirin EC  81 mg Oral Daily  . docusate sodium  100 mg Oral BID  . folic acid  1 mg Oral Daily  . gabapentin  100 mg Oral TID  . irbesartan  75 mg Oral QHS  . irbesartan  75 mg Oral Daily  . latanoprost  1 drop Both Eyes QHS  . lidocaine  1 patch Transdermal Q24H  . metoprolol  50 mg Oral BID  . pantoprazole  40 mg Oral Daily  . sodium chloride  3 mL Intravenous Q12H  . timolol   1 drop Both Eyes q morning - 10a   Continuous Infusions:  PRN Meds:.acetaminophen, ondansetron (ZOFRAN) IV  Blood pressure 146/94, pulse 83, temperature 98.4 F (36.9 C), temperature source Oral, resp. rate 19, height 5\' 5"  (1.651 m), weight 53.4 kg (117 lb 11.6 oz), SpO2 100.00%.   Results for orders placed during the hospital encounter of 03/13/13 (from the past 48 hour(s))  CK     Status: Abnormal   Collection Time    03/15/13  5:14 AM      Result Value Range   Total CK 746 (*) 7 - 177 U/L  COMPREHENSIVE  METABOLIC PANEL     Status: Abnormal   Collection Time    03/15/13  5:14 AM      Result Value Range   Sodium 139  135 - 145 mEq/L   Potassium 4.8  3.5 - 5.1 mEq/L   Comment: DELTA CHECK NOTED   Chloride 109  96 - 112 mEq/L   CO2 20  19 - 32 mEq/L   Glucose, Bld 87  70 - 99 mg/dL   BUN 31 (*) 6 - 23 mg/dL   Creatinine, Ser 1.19 (*) 0.50 - 1.10 mg/dL   Calcium 8.1 (*) 8.4 - 10.5 mg/dL   Total Protein 6.1  6.0 - 8.3 g/dL   Albumin 3.0 (*) 3.5 - 5.2 g/dL   AST 52 (*) 0 - 37 U/L   ALT 38 (*) 0 - 35 U/L   Alkaline Phosphatase 68  39 - 117 U/L   Total Bilirubin 0.6  0.3 - 1.2 mg/dL   GFR calc non Af Amer 39 (*) >90 mL/min   GFR calc Af Amer 46 (*) >90 mL/min   Comment:            The eGFR has been calculated     using the CKD EPI equation.     This calculation has not been     validated in all clinical     situations.     eGFR's persistently     <90 mL/min signify     possible Chronic Kidney Disease.  CBC     Status: None   Collection Time    03/15/13  5:14 AM      Result Value Range   WBC 9.5  4.0 - 10.5 K/uL   RBC 4.61  3.87 - 5.11 MIL/uL   Hemoglobin 13.8  12.0 - 15.0 g/dL   HCT 14.7  82.9 - 56.2 %   MCV 91.3  78.0 - 100.0 fL   MCH 29.9  26.0 - 34.0 pg   MCHC 32.8  30.0 - 36.0 g/dL   RDW 13.0  86.5 - 78.4 %   Platelets 162  150 - 400 K/uL  LIPID PANEL     Status: None   Collection Time    03/16/13  4:54 AM      Result Value Range   Cholesterol 133  0 - 200  mg/dL   Triglycerides 696  <295 mg/dL   HDL 47  >28 mg/dL   Total CHOL/HDL Ratio 2.8     VLDL 23  0 - 40 mg/dL   LDL Cholesterol 63  0 - 99 mg/dL   Comment:            Total Cholesterol/HDL:CHD Risk     Coronary Heart Disease Risk Table                         Men   Women      1/2 Average Risk   3.4   3.3      Average Risk       5.0   4.4      2 X Average Risk   9.6   7.1      3 X Average Risk  23.4   11.0                Use the calculated Patient Ratio     above and the CHD Risk Table     to determine the patient's CHD Risk.  ATP III CLASSIFICATION (LDL):      <100     mg/dL   Optimal      478-295  mg/dL   Near or Above                        Optimal      130-159  mg/dL   Borderline      621-308  mg/dL   High      >657     mg/dL   Very High    [facility] Wo Contrast  03/15/2013   *RADIOLOGY REPORT*  Clinical Data: Acute right posterior cerebral artery distribution infarct.  MRA HEAD WITHOUT CONTRAST  Technique: Angiographic images of the Circle of Willis were obtained using MRA technique without intravenous contrast.  Comparison: 03/14/2013 brain MR.  Findings: Right internal carotid artery cavernous and supraclinoid segment regions of mild moderate narrowing and ectasia.  Left internal carotid artery slightly smaller than the right which may be related to the proximal stenosis.  Left internal carotid artery cavernous segment regions of mild to moderate narrowing.  Loss of signal distal M1 segment right middle cerebral artery and adjacent right middle cerebral artery bifurcation partially technical in origin although there may be underlying mild moderate stenosis.  Mild to moderate focal narrowing distal M1 segment left middle cerebral artery.  Middle cerebral artery moderate branch vessel irregularity bilaterally.  Moderate narrowing A1 segment left anterior cerebral artery.  Artifact extends to the distal vertebral arteries.  Mild to moderate narrowing of the vertebral  arteries.  Moderate to marked narrowing left PICA.  Marked narrowing and poor delineation right PICA.  Ectatic basilar artery with mild narrowing and  irregularity.  Moderate narrowing right AICA.  Mild narrowing left AICA.  Moderate narrowing superior cerebral artery bilaterally.  Marked focal stenosis proximal right posterior cerebral artery with marked narrowing and irregularity beyond this region with poor delineation of a majority of the right posterior cerebral artery.  Marked tandem stenosis of the left posterior cerebral artery.  No aneurysm noted.  IMPRESSION:  Intracranial atherosclerotic type changes as detailed above. Most significant abnormality is the marked tandem stenosis involving the posterior cerebral arteries more notable on the right.   Original Report Authenticated By: Lacy Duverney, M.D.   US Carotid Duplex Bilateral  03/15/2013   *RADIOLOGY REPORT*  Clinical Data: Multifocal cerebral infarction. History of hypertension.  BILATERAL CAROTID DUPLEX ULTRASOUND  Technique: Wallace Cullens scale imaging, color Doppler and duplex ultrasound were performed of bilateral carotid and vertebral arteries in the neck.  Comparison:  None.  Criteria:  Quantification of carotid stenosis is based on velocity parameters that correlate the residual internal carotid diameter with NASCET-based stenosis levels, using the diameter of the distal internal carotid lumen as the denominator for stenosis measurement.  The following velocity measurements were obtained:                   PEAK SYSTOLIC/END DIASTOLIC RIGHT ICA:                        63/19cm/sec CCA:                        69/11cm/sec SYSTOLIC ICA/CCA RATIO:     0.9 DIASTOLIC ICA/CCA RATIO:    1.8 ECA:                        77cm/sec  LEFT ICA:                        57/19cm/sec CCA:                        77/14cm/sec SYSTOLIC ICA/CCA RATIO:     0.7 DIASTOLIC ICA/CCA RATIO:    1.3 ECA:                        44cm/sec  Findings:  RIGHT CAROTID ARTERY: The common  carotid artery is very tortuous in the neck.  A relatively mild amount of mixed plaque is noted at the level of the carotid bulb and proximal ICA.  Estimated right ICA stenosis is less than 50%.  RIGHT VERTEBRAL ARTERY:  Antegrade flow with normal wave form.  LEFT CAROTID ARTERY: Relatively mild amount of calcified and noncalcified plaque is noted at the level of the carotid bulb and proximal ICA.  Estimated left ICA stenosis is less than 50%.  The left ICA is tortuous.  LEFT VERTEBRAL ARTERY:  Antegrade flow with normal wave form.  IMPRESSION: Relatively mild amount of atherosclerotic plaque at both carotid bifurcations.  Estimated bilateral ICA stenoses are less than 50% in the neck.   Original Report Authenticated By: Irish Lack, M.D.    MRI BRAIN Areas of acute infarction in the right posterior cerebral artery  territory affecting the antrum medial temporal lobe, occipital lobe  and hippocampus on the right. There are some petechial blood  products associated with the anterior temporal infarction.  Extensive chronic small vessel changes elsewhere throughout the  brain.     Renee Leblanc A. Gerilyn Leblanc, M.D.  Diplomate, Biomedical engineer of Psychiatry and Neurology ( Neurology). 03/16/2013, 8:15 PM

## 2013-03-16 NOTE — Clinical Social Work Note (Signed)
Patient given bed offers, son and daughter in law in room and participating in decision making process w patient consent.  Patient chose [facility] which is close to son's home in [place].  Family requests EMS transport for patient comfort, willing to pay for transport if needed.  MD notified of patient choice.  Santa Genera, LCSW Clinical Social Worker [phone])

## 2013-03-17 ENCOUNTER — Encounter (HOSPITAL_COMMUNITY): Payer: Self-pay | Admitting: Internal Medicine

## 2013-03-17 DIAGNOSIS — I69319 Unspecified symptoms and signs involving cognitive functions following cerebral infarction: Secondary | ICD-10-CM

## 2013-03-17 HISTORY — DX: Unspecified symptoms and signs involving cognitive functions following cerebral infarction: I69.319

## 2013-03-17 LAB — BASIC METABOLIC PANEL
CO2: 27 mEq/L (ref 19–32)
Calcium: 9.5 mg/dL (ref 8.4–10.5)
Creatinine, Ser: 0.73 mg/dL (ref 0.50–1.10)
GFR calc non Af Amer: 71 mL/min — ABNORMAL LOW (ref >90)
Sodium: 143 mEq/L (ref 135–145)

## 2013-03-17 LAB — CK: Total CK: 276 U/L — ABNORMAL HIGH (ref 7–177)

## 2013-03-17 MED ORDER — OXYCODONE-ACETAMINOPHEN 5-500 MG PO CAPS: 1.0000 | ORAL_CAPSULE | Freq: Four times a day (QID) | ORAL | Status: AC | PRN

## 2013-03-17 MED ORDER — DOCUSATE SODIUM 100 MG PO CAPS: 100.0000 mg | ORAL_CAPSULE | Freq: Two times a day (BID) | ORAL | Status: AC

## 2013-03-17 MED ORDER — AMLODIPINE BESYLATE 5 MG PO TABS: 2.5000 mg | ORAL_TABLET | Freq: Every day | ORAL | Status: DC

## 2013-03-17 MED ORDER — METOPROLOL TARTRATE 50 MG PO TABS: 50.0000 mg | ORAL_TABLET | Freq: Two times a day (BID) | ORAL | Status: AC

## 2013-03-17 MED ORDER — IRBESARTAN 150 MG PO TABS
150.0000 mg | ORAL_TABLET | Freq: Every day | ORAL | Status: DC
  Administered 2013-03-17: 150 mg via ORAL
  Filled 2013-03-17: qty 1

## 2013-03-17 MED ORDER — HYDRALAZINE HCL 20 MG/ML IJ SOLN
10.0000 mg | Freq: Four times a day (QID) | INTRAMUSCULAR | Status: DC | PRN
  Administered 2013-03-17: 10 mg via INTRAVENOUS
  Filled 2013-03-17: qty 1

## 2013-03-17 MED ORDER — DILTIAZEM HCL ER COATED BEADS 180 MG PO CP24: 180.0000 mg | ORAL_CAPSULE | Freq: Every day | ORAL | Status: AC

## 2013-03-17 MED ORDER — DILTIAZEM HCL ER COATED BEADS 180 MG PO CP24
180.0000 mg | ORAL_CAPSULE | Freq: Every day | ORAL | Status: DC
  Administered 2013-03-17: 180 mg via ORAL
  Filled 2013-03-17: qty 1

## 2013-03-17 MED ORDER — ASPIRIN 81 MG PO TBEC: 81.0000 mg | DELAYED_RELEASE_TABLET | Freq: Every day | ORAL | Status: AC

## 2013-03-17 MED ORDER — IRBESARTAN 150 MG PO TABS: 150.0000 mg | ORAL_TABLET | Freq: Every day | ORAL | Status: AC

## 2013-03-17 NOTE — Clinical Social Work Placement (Signed)
Clinical Social Work Department CLINICAL SOCIAL WORK PLACEMENT NOTE 03/17/2013  Patient:  Renee Leblanc, Renee Leblanc  Account Number:  [id number] Admit date:  03/13/2013  Clinical Social Worker:  Santa Genera, CLINICAL SOCIAL WORKER  Date/time:  03/14/2013 10:30 AM  Clinical Social Work is seeking post-discharge placement for this patient at the following level of care:   SKILLED NURSING   (*CSW will update this form in Epic as items are completed)   03/14/2013  Patient/family provided with [facility] Department of Clinical Social Work's list of facilities offering this level of care within the geographic area requested by the patient (or if unable, by the patient's family).  03/14/2013  Patient/family informed of their freedom to choose among providers that offer the needed level of care, that participate in Medicare, Medicaid or managed care program needed by the patient, have an available bed and are willing to accept the patient.  03/14/2013  Patient/family informed of [facility]' ownership interest in [facility], as well as of the fact that they are under no obligation to receive care at this facility.  PASARR submitted to EDS on 03/14/2013 PASARR number received from [employer] on 03/14/2013  FL2 transmitted to all facilities in geographic area requested by pt/family on  03/14/2013 FL2 transmitted to all facilities within larger geographic area on 03/14/2013  Patient informed that his/her managed care company has contracts with or will negotiate with  certain facilities, including the following:     Patient/family informed of bed offers received:  03/15/2013 Patient chooses bed at [facility] Physician recommends and patient chooses bed at  [facility]  Patient to be transferred to [facility] on  03/17/2013 Patient to be transferred to facility by [facility]  The following physician request were entered in Epic:   Additional Comments: Patient had preexisting  PASARR.  Patient states that she wants to return home, but willing to consider SNF offers.  Derenda Fennel, [place] [phone]

## 2013-03-17 NOTE — Discharge Summary (Signed)
Physician Discharge Summary  Renee Leblanc ZOX:[id number] DOB: 1916-11-18 DOA: 03/13/2013  PCP: Colette Ribas, MD  Admit date: 03/13/2013 Discharge date: 03/17/2013  Time spent: Greater than 30 minutes  Recommendations for Outpatient Follow-up:  1. The patient will need her renal function and potassium level reassessed in one week.  Discharge Diagnoses:   1. Acute ischemic/embolic stroke in the right posterior cerebral artery territory, occipital lobe and hippocampus territory on the right. Associated extensive chronic small vessel changes and chronic petechial blood products associated with the anterior temporal infarction. 2. Newly diagnosed atrial fibrillation with rapid ventricular response. She was felt to not be a candidate for Coumadin secondary to her age and falls. Antiplatelet therapy started. 3. Cognitive deficit and acute encephalopathy secondary to the acute strokes. 4. Deconditioning secondary to the strokes, necessitating skilled nursing facility placement. 5. Fall at home, secondary to strokes and chronic DJD of the lumbar spine.. 6. Malignant hypertension. 7. Mild rhabdomyolysis secondary to fall. 8. Dehydration/acute renal failure. 9. Mildly elevated LFTs secondary to rhabdomyolysis. 10. History of degenerative joint disease of the lumbar spine with associated sciatica.   Discharge Condition: Stable  Diet recommendation: Heart healthy  Filed Weights   03/14/13 0500 03/16/13 0454 03/17/13 0500  Weight: 50 kg (110 lb 3.7 oz) 53.4 kg (117 lb 11.6 oz) 53.1 kg (117 lb 1 oz)    History of present illness:  The patient is a 79 year old previously independent living lady with a history of degenerative joint disease, diverticulosis, glaucoma, and hypertension, who presented to the emergency department on 03/14/2013 after being found down at home by her son. Apparently, the patient had fallen and laid on the floor overnight. She was reportedly confused and did not  realize what happened. She was found to have bruising on her anterior knees and was complaining of left anterior chest pain. In the emergency department, she was found to be hypertensive and tachycardic. She was found to be in atrial fibrillation which was new according to the family. Her lab data were significant for a BUN of 30, creatinine 1.65, glucose of 147, CK of 1147, WBC of 15.2, hemoglobin of 17.4, and a urinalysis that revealed greater than 300 protein, 21-50 WBCs, and 11-20 RBCs. X-rays of her hips and her knees revealed degenerative joint disease but no acute fractures. The EKG revealed atrial fibrillation with junctional rhythm at a heart rate of 85 beats per minute. She was admitted for further evaluation and management.  Hospital Course:   The patient was started on IV fluids for hydration. Rocephin was started empirically for what was thought to be a urinary tract infection initially. After the urine culture proved to be negative, Rocephin was discontinued after 2 days. Because of her confusion, there was concern about a possible stroke versus other causes of her confusion.   1. Acute ischemic/query embolic strokes on the right. An MRI of her brain was ordered to evaluate for her confusion and falls. The MRI revealed small strokes primarily on the right side of her brain with some petechial blood products associated with a temporal infarct. Subsequent MRI revealed extensive microvascular changes and marked stenosis involving the posterior cerebral arteries on the right, consistent with the strokes. Carotid ultrasound revealed no significant ICA stenosis. 2-D echocardiogram revealed an ejection fraction of 50-55%. She passed a swallowing evaluation. Antiplatelet therapy was started with aspirin. Given her age, confusion, and propensity for falls, the medical staff and the family agreed that aspirin would be preferable over Coumadin. She was  evaluated by the therapists. All recommended skilled  nursing facility placement for rehabilitation before returning to home. This was discussed with her family who was in total agreement. The patient demonstrated no profound unilateral weakness, but she was overall deconditioned. There was no significant facial droop or dysarthria. She did have some confusion that waxed and waned. She probably will have some residual cognitive deficits associated with the stroke, but they may not necessarily be permanent.   The patient's fasting lipid profile revealed a total cholesterol of 133, triglycerides of 114, HDL of 47, and LDL of 63. Statin therapy was not started in the setting of acute rhabdomyolysis and increased propensity for statin induced myopathy.  Atrial fibrillation with rapid ventricular response.  This was a new diagnosis. Bisoprolol was discontinued in favor of metoprolol. Metoprolol was titrated up to 50 mg twice a day. Diltiazem was eventually added primarily for better blood pressure control, but did help with rate control. Prior to hospital discharge, her heart rate was within normal limits. Her 2-D echocardiogram revealed preserved LV function. She was maintained on antiplatelet therapy as stated above.   Malignant hypertension. Avapro, Lasix, bisoprolol/HCTZ were discontinued on admission primarily because of acute renal failure and dehydration. Her blood pressure quickly increased. IV fluids were discontinued. Metoprolol was started and titrated up. Avapro was eventually restarted when her renal function normalized. Eventually, diltiazem was added. Lasix was restarted upon discharge. Avapro could be titrated to the home dose of 300 mg daily in the next week or 2 if the above measures do not improve her blood pressure. In the setting of acute strokes, delivered progressive decrease in her blood pressure was not recommended.   Elevated troponin I x1.  B. elevated troponin I was likely secondary to rapid ventricular rate. No evidence of acute  coronary syndrome. Her troponin I did normalize.   Acute renal failure. Resolved with IV fluids. Her creatinine was 10.73 upon discharge.  Unwitnessed fall at home. This resulted in rhabdomyolysis and dehydration. There were no obvious fractures radiographically. The fall was likely secondary to the strokes.   Mild rhabdomyolysis. Resolved with IV fluids.   Hypokalemia. Repleted.      Procedures:  2-D echocardiogram:Study Conclusions  - Left ventricle: There was moderate concentric hypertrophy. Systolic function was normal. The estimated ejection fraction was in the range of 50% to 55%. The study is not technically sufficient to allow evaluation of LV diastolic function, due to underlying atrial fibrillation. - Aortic valve: Trivial regurgitation. - Mitral valve: Moderately thickened leaflets, without stenosis. - Left atrium: The atrium was mild to moderately dilated. - Right atrium: Severely dilated. - Tricuspid valve: Moderate regurgitation. Transthoracic echocardiography. M-mode, complete 2D, spectral Doppler, and color Doppler. Height: Height: 165.1cm. Height: 65in. Weight: Weight: 49.9kg. Weight: 109.8lb. Body mass index: BMI: 18.3kg/m^2. Body surface area: BSA: 1.69m^2. Patient status: Inpatient. Location: ICU/CCU   Consultations:  None  Discharge Exam: Filed Vitals:   03/17/13 0635 03/17/13 0640 03/17/13 0700 03/17/13 0800  BP: 185/114 183/92  177/118  Pulse:      Temp:    98.4 F (36.9 C)  TempSrc:    Oral  Resp:   25 20  Height:      Weight:      SpO2:        General: Pleasant elderly and occasionally confused 79 year old African-American woman sitting up in bed, in no acute distress. Cardiovascular: S1, S2, with occasional ectopy versus irregular, irregular. Respiratory: Decreased breath sounds in the bases, otherwise clear. Neurologic: She  is alert and oriented to the hospital and herself. She is occasionally confused. Cranial nerves reveal very  minimal dysarthria and minimal left facial droop. Right upper and right lower extremity strength is 5 minus over 5 in strength of her left upper extremity left lower extremity is approximately 4 minus over 5 (in the supine position).  Discharge Instructions  Discharge Orders   Future Orders Complete By Expires     Diet - low sodium heart healthy  As directed     Increase activity slowly  As directed         Medication List    STOP taking these medications       nabumetone 500 MG tablet  Commonly known as:  RELAFEN     ZIAC PO      TAKE these medications       aspirin 81 MG EC tablet  Take 1 tablet (81 mg total) by mouth daily.     calcium carbonate 200 MG capsule  Take 250 mg by mouth daily.     diltiazem 180 MG 24 hr capsule  Commonly known as:  CARDIZEM CD  Take 1 capsule (180 mg total) by mouth daily.     docusate sodium 100 MG capsule  Commonly known as:  COLACE  Take 1 capsule (100 mg total) by mouth 2 (two) times daily.     folic acid 1 MG tablet  Commonly known as:  FOLVITE  Take 1 mg by mouth daily.     furosemide 20 MG tablet  Commonly known as:  LASIX  Take 20 mg by mouth daily.     gabapentin 100 MG capsule  Commonly known as:  NEURONTIN  Take 1 capsule (100 mg total) by mouth 3 (three) times daily.     irbesartan 150 MG tablet  Commonly known as:  AVAPRO  Take 1 tablet (150 mg total) by mouth at bedtime.     latanoprost 0.005 % ophthalmic solution  Commonly known as:  XALATAN  Place 1 drop into both eyes at bedtime.     lidocaine 5 %  Commonly known as:  LIDODERM  Place 1 patch onto the skin daily. Remove & Discard patch within 12 hours or as directed by MD     metoprolol 50 MG tablet  Commonly known as:  LOPRESSOR  Take 1 tablet (50 mg total) by mouth 2 (two) times daily.     omeprazole 20 MG capsule  Commonly known as:  PRILOSEC  Take 20 mg by mouth daily.     oxyCODONE-acetaminophen 5-500 MG per capsule  Commonly known as:  TYLOX   Take 1 capsule by mouth every 6 (six) hours as needed for pain.     potassium chloride SA 20 MEQ tablet  Commonly known as:  K-DUR,KLOR-CON  Take 20 mEq by mouth daily.     TIMOPTIC OP  Apply to eye.     Vitamin B1-B12 5.5-0.0125 MG/5ML Soln  Inject 1 Syringe as directed every 30 (thirty) days.       No Known Allergies    The results of significant diagnostics from this hospitalization (including imaging, microbiology, ancillary and laboratory) are listed below for reference.    Significant Diagnostic Studies: Dg Pelvis 1-2 Views  03/13/2013   *RADIOLOGY REPORT*  Clinical Data: Trauma.  Left pelvic pain.  PELVIS - 1-2 VIEW  Comparison: CT 01/23/2011  Findings: Single view of the pelvis was obtained. Surgical fixation of the proximal right femur with a dynamic hip screw.  Mild degenerative changes in both hips.  The bony pelvic ring is intact. Multiple phleboliths throughout the pelvis.  IMPRESSION: No acute bony abnormality to the pelvis.   Original Report Authenticated By: Richarda Overlie, M.D.   Ct Head Wo Contrast  03/13/2013   *RADIOLOGY REPORT*  Clinical Data: Fall.  Altered mental status  CT HEAD WITHOUT CONTRAST  Technique:  Contiguous axial images were obtained from the base of the skull through the vertex without contrast.  Comparison: None  Findings: Moderate atrophy.  Chronic microvascular ischemic changes.  Hypodensity left pons is most likely chronic.  No acute infarct.  Negative for hemorrhage or mass lesion.  No focal skull lesion.  Air-fluid level left maxillary sinus.  IMPRESSION: Atrophy and chronic microvascular ischemia.  No acute infarct or hemorrhage.  Air-fluid level left maxillary sinus.   Original Report Authenticated By: Janeece Riggers, M.D.   Mr [facility] Wo Contrast  03/15/2013   *RADIOLOGY REPORT*  Clinical Data: Acute right posterior cerebral artery distribution infarct.  MRA HEAD WITHOUT CONTRAST  Technique: Angiographic images of the Circle of Willis were obtained  using MRA technique without intravenous contrast.  Comparison: 03/14/2013 brain MR.  Findings: Right internal carotid artery cavernous and supraclinoid segment regions of mild moderate narrowing and ectasia.  Left internal carotid artery slightly smaller than the right which may be related to the proximal stenosis.  Left internal carotid artery cavernous segment regions of mild to moderate narrowing.  Loss of signal distal M1 segment right middle cerebral artery and adjacent right middle cerebral artery bifurcation partially technical in origin although there may be underlying mild moderate stenosis.  Mild to moderate focal narrowing distal M1 segment left middle cerebral artery.  Middle cerebral artery moderate branch vessel irregularity bilaterally.  Moderate narrowing A1 segment left anterior cerebral artery.  Artifact extends to the distal vertebral arteries.  Mild to moderate narrowing of the vertebral arteries.  Moderate to marked narrowing left PICA.  Marked narrowing and poor delineation right PICA.  Ectatic basilar artery with mild narrowing and  irregularity.  Moderate narrowing right AICA.  Mild narrowing left AICA.  Moderate narrowing superior cerebral artery bilaterally.  Marked focal stenosis proximal right posterior cerebral artery with marked narrowing and irregularity beyond this region with poor delineation of a majority of the right posterior cerebral artery.  Marked tandem stenosis of the left posterior cerebral artery.  No aneurysm noted.  IMPRESSION:  Intracranial atherosclerotic type changes as detailed above. Most significant abnormality is the marked tandem stenosis involving the posterior cerebral arteries more notable on the right.   Original Report Authenticated By: Lacy Duverney, M.D.   Mr Brain Wo Contrast  03/14/2013   *RADIOLOGY REPORT*  Clinical Data: Altered mental status.  MRI HEAD WITHOUT CONTRAST  Technique:  Multiplanar, multiecho pulse sequences of the brain and surrounding  structures were obtained according to standard protocol without intravenous contrast.  Comparison: Head CT 03/13/2013  Findings: No acute infarction of the brainstem or cerebellum.  There are areas of infarction in the right posterior cerebral artery territory.  There is a 2 cm acute infarction at the anterior medial temporal lobe on the right.  This contains some petechial blood products.  There is a 2 cm infarction in the right occipital cortex which appears bland.  There is a punctate acute infarction along the hippocampus adjacent to the atrium of the right lateral ventricle.  There are no other acute infarctions.  I think there are a few artifactual punctate foci of diffusion  signal in the cerebral hemispheres that do not reflect true disease.  There are old or chronic small vessel infarctions affecting the thalami, basal ganglia and hemispheric deep white matter.  No mass lesion. No hydrocephalus or extra-axial collection.  No pituitary mass. Small amount of fluid in the left maxillary sinus.  Major vessels at the base of the brain show flow.  IMPRESSION: Areas of acute infarction in the right posterior cerebral artery territory affecting the antrum medial temporal lobe, occipital lobe and hippocampus on the right.  There are some petechial blood products associated with the anterior temporal infarction.  Extensive chronic small vessel changes elsewhere throughout the brain.  Because of the findings, I discussed this case with Dr. Orvan Falconer at 19:35 hours.   Original Report Authenticated By: Paulina Fusi, M.D.   US Carotid Duplex Bilateral  03/15/2013   *RADIOLOGY REPORT*  Clinical Data: Multifocal cerebral infarction. History of hypertension.  BILATERAL CAROTID DUPLEX ULTRASOUND  Technique: Wallace Cullens scale imaging, color Doppler and duplex ultrasound were performed of bilateral carotid and vertebral arteries in the neck.  Comparison:  None.  Criteria:  Quantification of carotid stenosis is based on velocity  parameters that correlate the residual internal carotid diameter with NASCET-based stenosis levels, using the diameter of the distal internal carotid lumen as the denominator for stenosis measurement.  The following velocity measurements were obtained:                   PEAK SYSTOLIC/END DIASTOLIC RIGHT ICA:                        63/19cm/sec CCA:                        69/11cm/sec SYSTOLIC ICA/CCA RATIO:     0.9 DIASTOLIC ICA/CCA RATIO:    1.8 ECA:                        77cm/sec  LEFT ICA:                        57/19cm/sec CCA:                        77/14cm/sec SYSTOLIC ICA/CCA RATIO:     0.7 DIASTOLIC ICA/CCA RATIO:    1.3 ECA:                        44cm/sec  Findings:  RIGHT CAROTID ARTERY: The common carotid artery is very tortuous in the neck.  A relatively mild amount of mixed plaque is noted at the level of the carotid bulb and proximal ICA.  Estimated right ICA stenosis is less than 50%.  RIGHT VERTEBRAL ARTERY:  Antegrade flow with normal wave form.  LEFT CAROTID ARTERY: Relatively mild amount of calcified and noncalcified plaque is noted at the level of the carotid bulb and proximal ICA.  Estimated left ICA stenosis is less than 50%.  The left ICA is tortuous.  LEFT VERTEBRAL ARTERY:  Antegrade flow with normal wave form.  IMPRESSION: Relatively mild amount of atherosclerotic plaque at both carotid bifurcations.  Estimated bilateral ICA stenoses are less than 50% in the neck.   Original Report Authenticated By: Irish Lack, M.D.   Dg Chest Port 1 View  03/14/2013   *RADIOLOGY REPORT*  Clinical Data: Fall.  Tender left chest wall.  PORTABLE  CHEST - 1 VIEW  Comparison: 01/23/2011  Findings: Heart size is mildly enlarged.  No edema.  There are no focal consolidations or pleural effusions. Minimal right base atelectasis.  No evidence for pneumothorax or acute, displaced rib fracture. There is subacromial narrowing in the shoulders bilaterally.  IMPRESSION:  1. No evidence for acute cardiopulmonary  abnormality. 2.  No evidence for acute, displaced fracture.   Original Report Authenticated By: Norva Pavlov, M.D.   Dg Knee Complete 4 Views Left  03/13/2013   *RADIOLOGY REPORT*  Clinical Data: Trauma and knee pain.  LEFT KNEE - COMPLETE 4+ VIEW  Comparison: None.  Findings: Joint space narrowing in the medial knee compartment with osteophyte formations.  No significant suprapatellar joint effusion.  The knee appears to be located.  No evidence for acute fracture.  IMPRESSION: Degenerative changes without acute bony abnormality.   Original Report Authenticated By: Richarda Overlie, M.D.   Dg Knee Complete 4 Views Right  03/13/2013   *RADIOLOGY REPORT*  Clinical Data: Trauma and knee pain.  RIGHT KNEE - COMPLETE 4+ VIEW  Comparison: 12/22/2008  Findings: Severe joint space narrowing involving the medial knee compartment with osteophyte formations.  Evidence for a small suprapatellar joint effusion.  No evidence for a fracture or dislocation.  IMPRESSION: Severe degenerative changes in the right knee.  No acute bony abnormality.   Original Report Authenticated By: Richarda Overlie, M.D.    Microbiology: Recent Results (from the past 240 hour(s))  URINE CULTURE     Status: None   Collection Time    03/13/13  5:48 PM      Result Value Range Status   Specimen Description URINE, CATHETERIZED   Final   Special Requests NONE   Final   Culture  Setup Time 03/13/2013 18:31   Final   Colony Count NO GROWTH   Final   Culture NO GROWTH   Final   Report Status 03/15/2013 FINAL   Final  MRSA PCR SCREENING     Status: None   Collection Time    03/13/13 10:15 PM      Result Value Range Status   MRSA by PCR NEGATIVE  NEGATIVE Final   Comment:            The GeneXpert MRSA Assay (FDA     approved for NASAL specimens     only), is one component of a     comprehensive MRSA colonization     surveillance program. It is not     intended to diagnose MRSA     infection nor to guide or     monitor treatment for      MRSA infections.     Labs: Basic Metabolic Panel:  Recent Labs Lab 03/13/13 1608 03/14/13 0207 03/15/13 0514 03/17/13 0757  NA 143 142 139 143  K 3.5 2.7* 4.8 3.8  CL 101 101 109 108  CO2 30 28 20 27   GLUCOSE 147* 128* 87 82  BUN 30* 32* 31* 19  CREATININE 1.65* 1.40* 1.15* 0.73  CALCIUM 10.1 9.1 8.1* 9.5   Liver Function Tests:  Recent Labs Lab 03/13/13 1608 03/14/13 0207 03/15/13 0514  AST 46* 48* 52*  ALT 31 29 38*  ALKPHOS 92 78 68  BILITOT 1.2 0.6 0.6  PROT 8.0 7.2 6.1  ALBUMIN 4.0 3.5 3.0*   No results found for this basename: LIPASE, AMYLASE,  in the last 168 hours No results found for this basename: AMMONIA,  in the last 168 hours CBC:  Recent Labs Lab 03/13/13 1608 03/14/13 0207 03/15/13 0514  WBC 15.2* 13.1* 9.5  NEUTROABS 12.8*  --   --   HGB 17.4* 15.7* 13.8  HCT 51.8* 47.1* 42.1  MCV 89.6 89.2 91.3  PLT 174 188 162   Cardiac Enzymes:  Recent Labs Lab 03/13/13 1608 03/14/13 0207 03/14/13 0913 03/14/13 1949 03/15/13 0514 03/17/13 0757  CKTOTAL 1147*  --  1262*  --  746* 276*  TROPONINI <0.30 0.41*  --  <0.30  --   --    BNP: BNP (last 3 results) No results found for this basename: PROBNP,  in the last 8760 hours CBG: No results found for this basename: GLUCAP,  in the last 168 hours     Signed:  Artemisia Auvil  [facility] 03/17/2013, 12:23 PM

## 2013-03-17 NOTE — Progress Notes (Signed)
TRANSFER REPORT CALLED TO [facility] IMMEDIATELY AFTER PT PICKED UP BY EMS TRANSPORT STAFF. PT ALERT,PLEASANT,BUT CONFUSED. VSS. PT HAD DENTURES IN PLACE. TED HOSE ON. ALL BELONGINGS SENT W/ PT.PT DENIED ANY PAIN,SOB OR DISCOMFORT.

## 2013-03-17 NOTE — Clinical Social Work Note (Signed)
Patient ready for discharge today, will discharge to [facility] at the [facility] [phone]) in [place], [place].  Will transfer via [employer].  Facility, patient and family aware and agreeable to transfer.  FL2 reviewed w RN and updated as needed.  Discharge packet prepared and placed w shadow chart for transport.  Son has been to SNF to sign preadmission paperwork.  EMS will be called when discharge order is placed.  CSW standing by to complete discharge when appropriate.  Santa Genera, LCSW Clinical Social Worker [phone])

## 2013-03-17 NOTE — Consult Note (Signed)
[facility] Bridgitte Felicetti A. Gerilyn Pilgrim, MD     [email or web address]        Renee Leblanc, Renee Leblanc              ACCOUNT NO.:  [id number]  MEDICAL RECORD NO.:  [id number]  LOCATION:  IC11                          FACILITY:  [facility]  PHYSICIAN:  Inza Mikrut A. Gerilyn Pilgrim, M.D. DATE OF BIRTH:  [birth year]  DATE OF CONSULTATION: DATE OF DISCHARGE:                                CONSULTATION   Acute right PCA infarct presenting with confusion and loss of consciousness.  The patient has significant residual confusion, which is likely due to her stroke.  She also had extensive chronic ischemic white matter changes and is at increased risk of developing vascular dementia, especially with the acute infarct.  It may also be an issue with sundowning.  Recommendations are antiplatelet agents with aspirin.  It is suggested also the patient should be on a statin medication. Consider dementia labs such as thyroid function tests and vitamin B12 level and homocysteine level.  The patient is a 79 year old white female, who is very active.  She lives independently, drives, and does her ADLs.  The son did not see her for a day and sent police to check the situation out. The patient was found unresponsive on the floor, confused.  She did sustain some superficial bruises of the lower extremities.  She has been worked up and the evaluation shows an acute PCA infarct on the right side.  The patient has improved, but remains confused and disoriented.  REVIEW OF SYSTEMS:  Limited, but she complains of pain involving the extremities.  She reports weakness of the extremities from both sides. She does not complain of dizziness, chest pain, shortness of breath, GU/GI symptoms, or headaches.  PHYSICAL EXAMINATION:  GENERAL:  A thin pleasant lady, in no acute distress. HEENT:  Head is normocephalic, atraumatic. NECK:  Supple. ABDOMEN:  Soft. EXTREMITIES:  There are few superficial bruises involving the  legs. NEUROLOGIC:  Mentation:  She is awake and alert.  She is disoriented, however, she thinks she is at home and she asks repeatedly how come we got into her house.  I did try to reorient her, but she still remains confused.  She speaks in full clear sentences, however, cranial nerve evaluation, pupils are 4 mm and reactive.  Visual fields are intact. Extraocular movements are full.  Facial and muscle strength is symmetric.  Tongue is midline.  Motor examination shows diffuse weakness throughout 4-/5 in both the upper and lower extremities.  No clear drift.  Bulk and tone are normal throughout.  Coordination shows no dysmetria or tremors.  Reflexes are preserved throughout.  Sensation is unreliable, but normal to light touch.  She does not extinguish to double simultaneous stimulation.     Tou Hayner A. Gerilyn Pilgrim, M.D.     KAD/MEDQ  D:  03/16/2013  T:  03/17/2013  Job:  [id number]

## 2013-03-22 ENCOUNTER — Inpatient Hospital Stay: Payer: Self-pay | Admitting: Student

## 2013-03-22 LAB — URINALYSIS, COMPLETE
Bacteria: NONE SEEN
Bilirubin,UR: NEGATIVE
Glucose,UR: 50 mg/dL (ref 0–75)
Hyaline Cast: 8
Ketone: NEGATIVE
Leukocyte Esterase: NEGATIVE
Nitrite: NEGATIVE
Ph: 8 (ref 4.5–8.0)
Protein: 100
RBC,UR: 2 /HPF (ref 0–5)
Specific Gravity: 1.008 (ref 1.003–1.030)
Squamous Epithelial: 1
WBC UR: 4 /HPF (ref 0–5)

## 2013-03-22 LAB — CBC
HCT: 47.6 % — ABNORMAL HIGH (ref 35.0–47.0)
HGB: 15.7 g/dL (ref 12.0–16.0)
MCH: 29.6 pg (ref 26.0–34.0)
MCHC: 33.1 g/dL (ref 32.0–36.0)
MCV: 90 fL (ref 80–100)
Platelet: 267 x10 3/mm 3 (ref 150–440)
RBC: 5.31 X10 6/mm 3 — ABNORMAL HIGH (ref 3.80–5.20)
RDW: 14 % (ref 11.5–14.5)
WBC: 6.5 x10 3/mm 3 (ref 3.6–11.0)

## 2013-03-22 LAB — COMPREHENSIVE METABOLIC PANEL
BUN: 34 mg/dL — ABNORMAL HIGH (ref 7–18)
Creatinine: 1.16 mg/dL (ref 0.60–1.30)
Glucose: 166 mg/dL — ABNORMAL HIGH (ref 65–99)
Osmolality: 285 (ref 275–301)
SGOT(AST): 73 U/L — ABNORMAL HIGH (ref 15–37)
SGPT (ALT): 52 U/L (ref 12–78)

## 2013-03-22 LAB — APTT: Activated PTT: 26.5 secs (ref 23.6–35.9)

## 2013-03-22 LAB — CK TOTAL AND CKMB (NOT AT ARMC)
CK, Total: 52 U/L (ref 21–215)
CK-MB: 1.6 ng/mL (ref 0.5–3.6)

## 2013-03-23 LAB — LIPID PANEL
Cholesterol: 157 mg/dL (ref 0–200)
HDL Cholesterol: 54 mg/dL (ref 40–60)
Triglycerides: 63 mg/dL (ref 0–200)
VLDL Cholesterol, Calc: 13 mg/dL (ref 5–40)

## 2013-04-01 ENCOUNTER — Ambulatory Visit: Payer: Self-pay | Admitting: Internal Medicine

## 2013-05-02 DEATH — deceased

## 2013-09-02 IMAGING — CR DG HIP COMPLETE 2+V*R*
4 series · 4 of 4 positions shown · non-contrast
Comparison: None.

CLINICAL DATA: Osteoarthrosis

RIGHT HIP - COMPLETE 2+ VIEW

[view not recorded (1 of 4)]
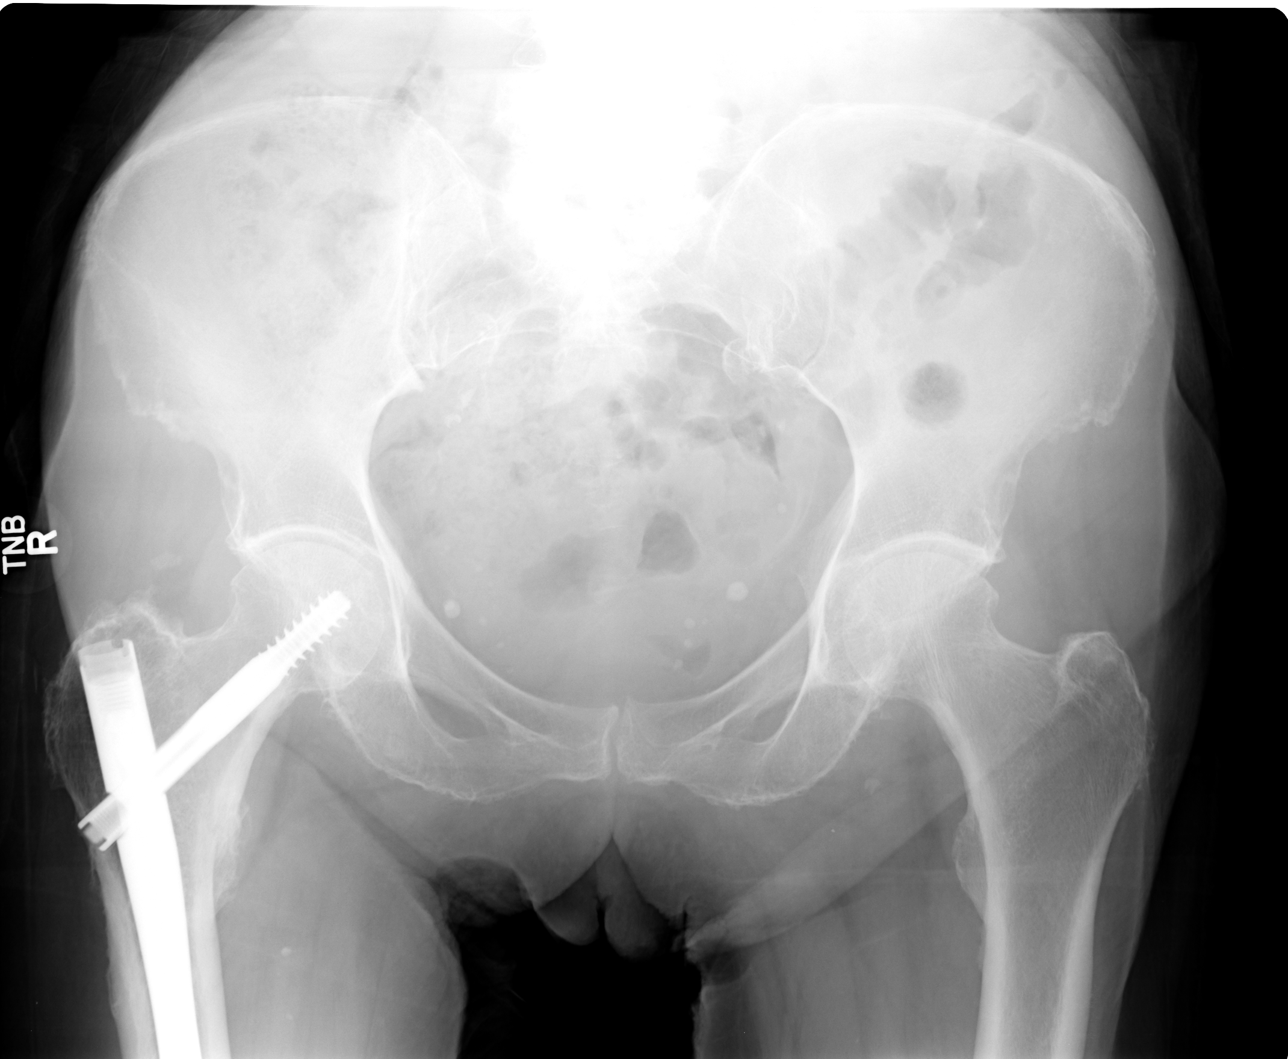

[view not recorded (2 of 4)]
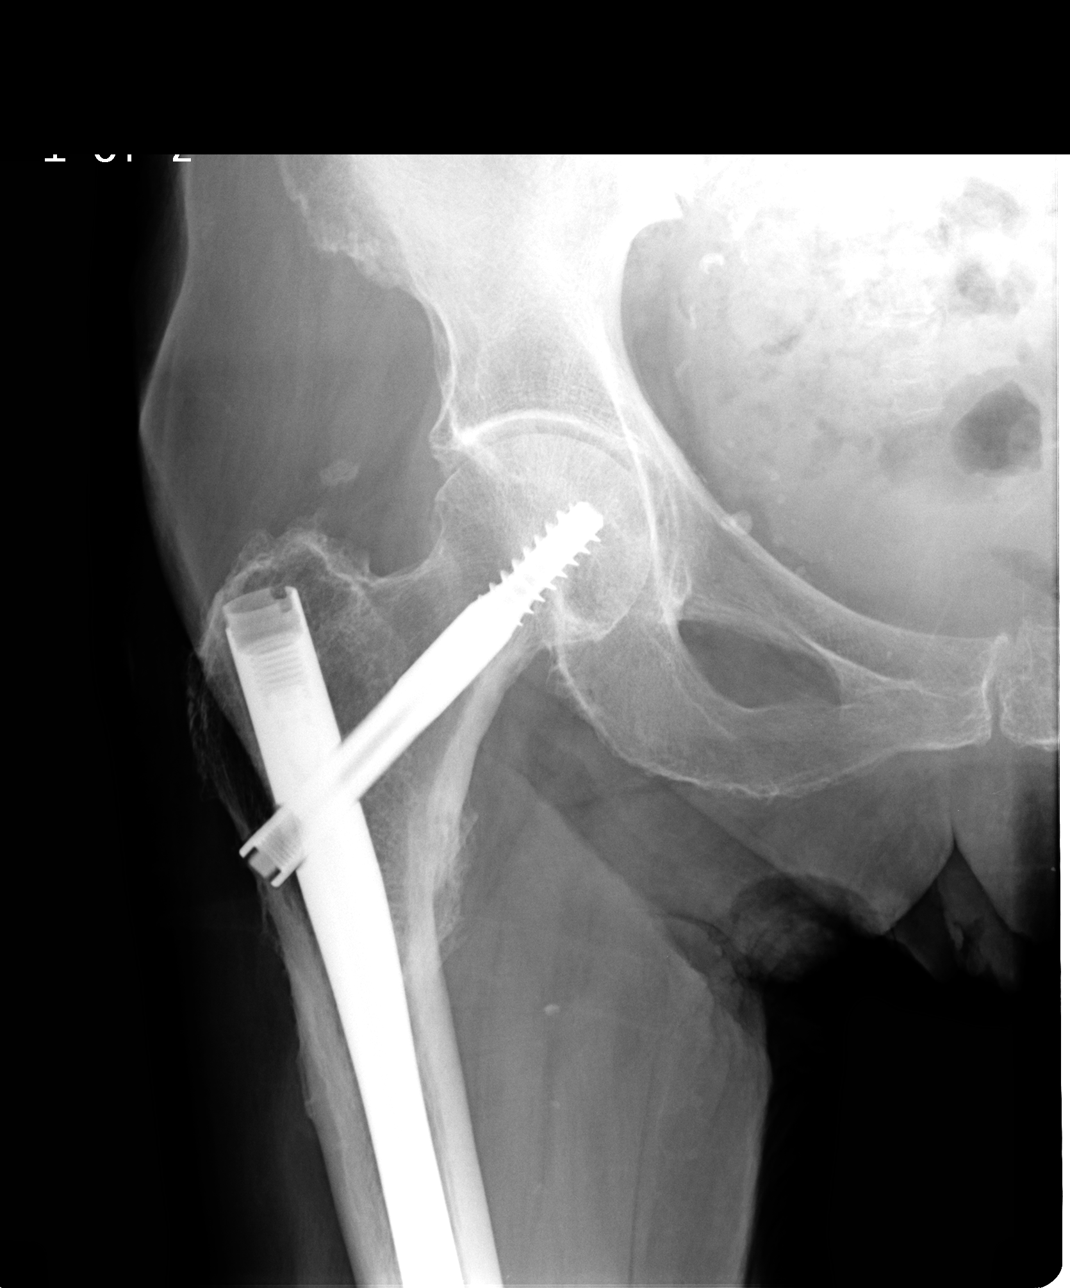

[view not recorded (3 of 4)]
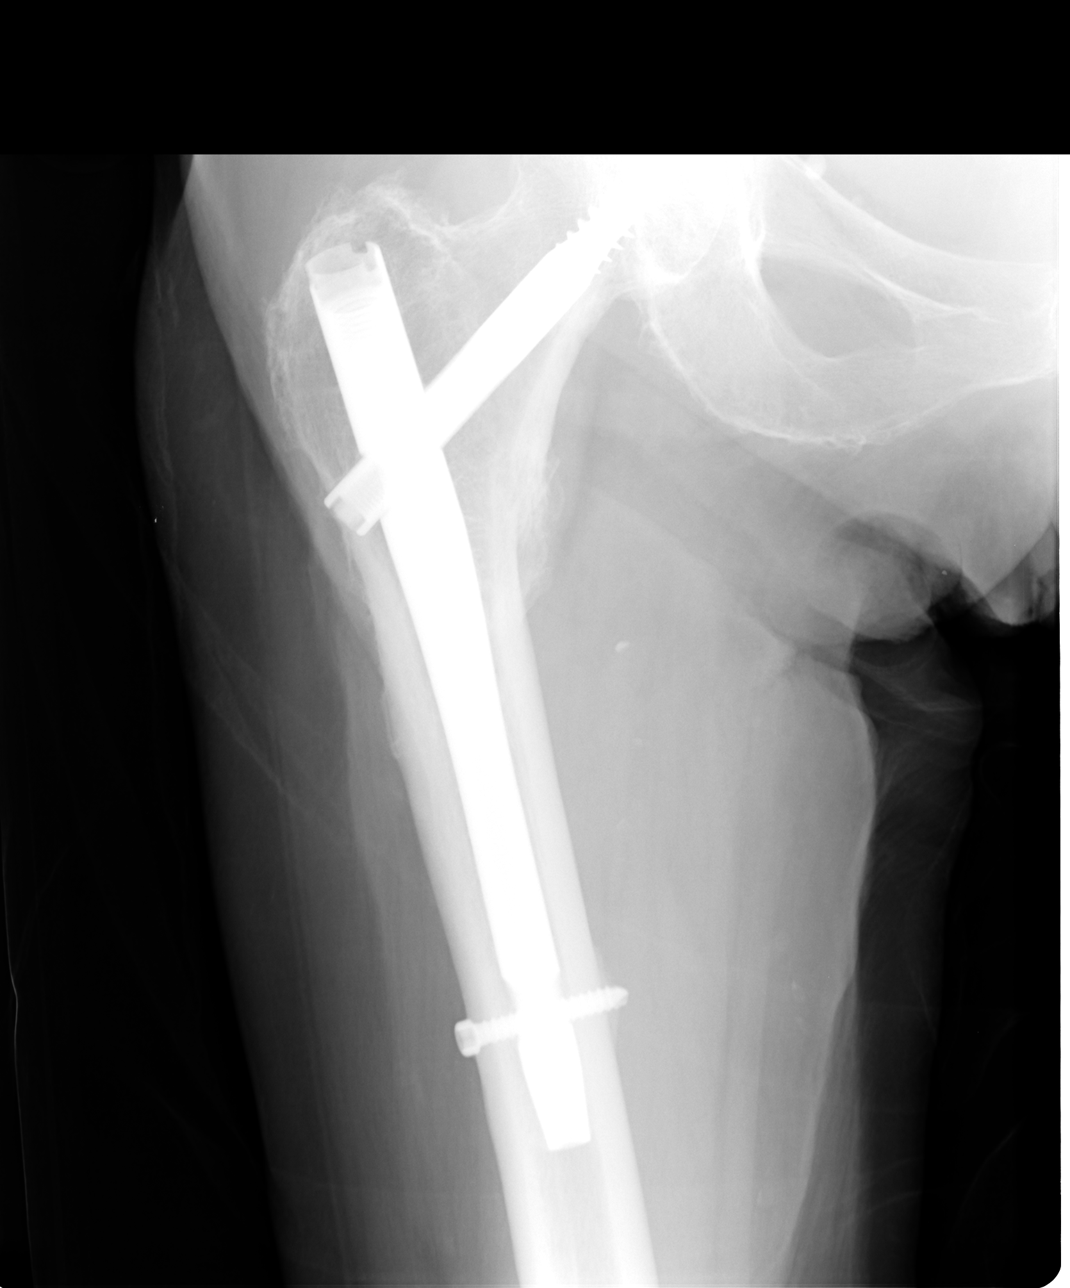

[view not recorded (4 of 4)]
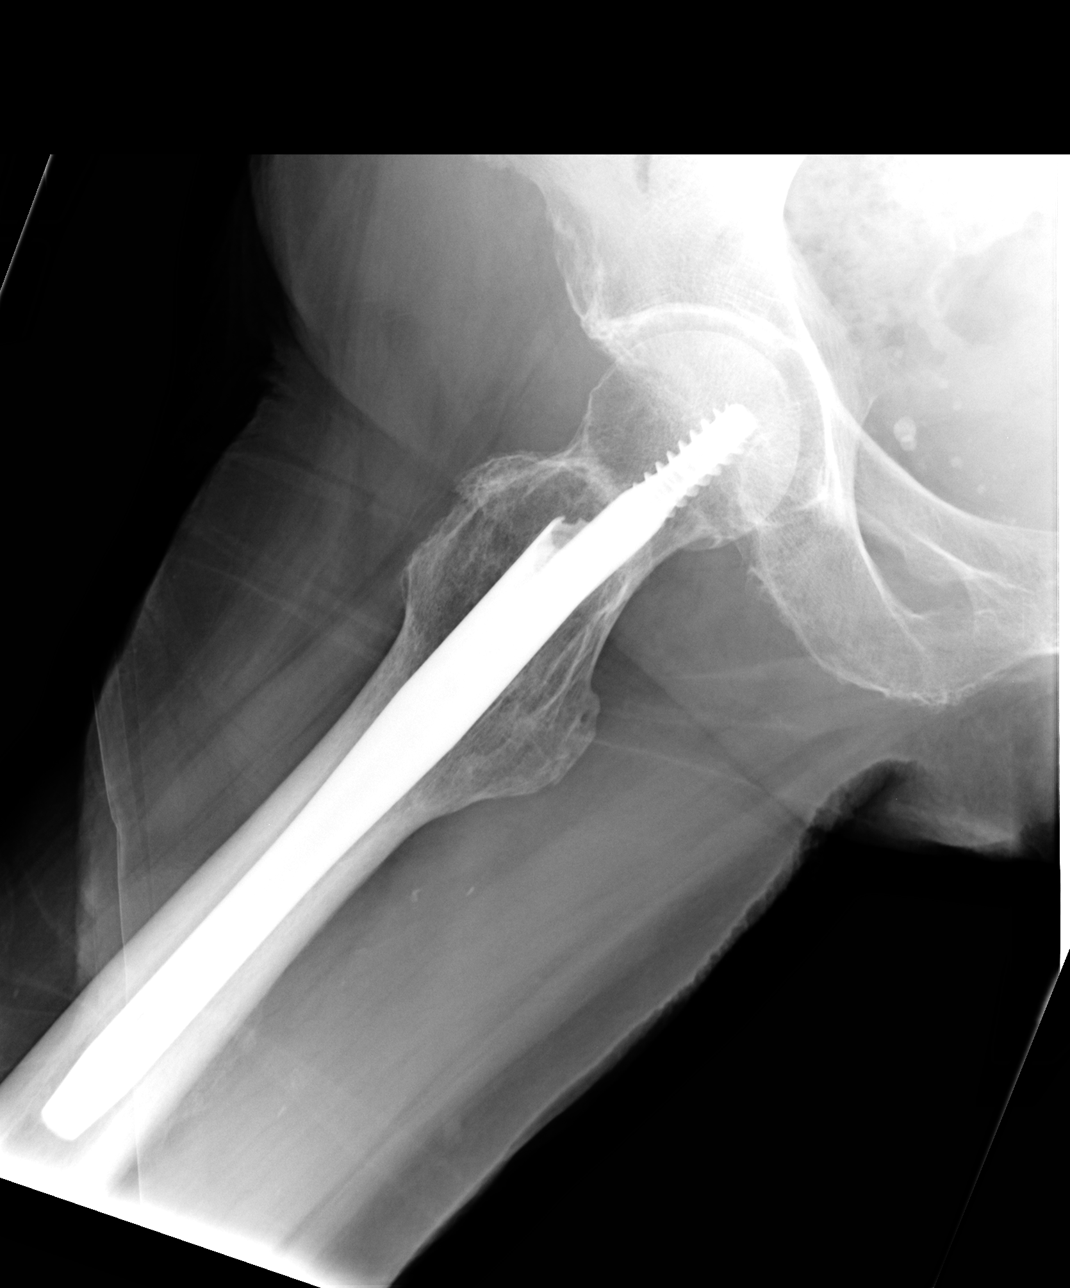

[4 of 4 positions shown; findings below may reference images not displayed]

FINDINGS: Four views of the right wrist submitted.  No acute
fracture or subluxation.  Metallic fixation rod and benign noted in
proximal right femur.  Mild degenerative changes bilateral hip
joints with narrowing of superior joint space.  Pelvic phleboliths
are noted.
IMPRESSION: No acute fracture or subluxation.  Postsurgical changes right
femur.  Mild degenerative changes bilateral hip joints.

## 2014-12-22 NOTE — Consult Note (Signed)
PATIENT NAME:  Renee Leblanc, Renee Leblanc MR#:  [id number] DATE OF BIRTH:  1917-01-15  DATE OF CONSULTATION:  03/23/2013  REFERRING PHYSICIAN:  Dr. Lenore Manner CONSULTING PHYSICIAN:  Doneta Public. Melrose Nakayama, MD  CHIEF COMPLAINT:  Acute stroke.  HISTORY OF PRESENT ILLNESS:  A 81 year old [place] American woman presents with what appears to be a new acute stroke in the setting of a recent stroke with subsequent right hemiparesis.  During a recent hospitalization the patient was found to have atrial fibrillation.  She was ultimately discharged to a rehabilitation facility in her area.  The patient was seen to have altered mental status today and was brought back to the hospital.  She became nonverbal and stopped moving her right arm and leg.  Upon readmission to the hospital the patient is found to have dense hemiplegia on the right whereby she is unable to move the arm or leg to any degree.  The patient also has neglect on the right side.  She has gaze preference to the left.  She does not follow any commands.  She does not make any spontaneous movements on the right side.  The patient had a head CT in the Emergency Department which showed no acute findings.    REVIEW OF SYSTEMS:   I was unable to perform a review of systems because this patient was nonverbal at the present time.   PAST MEDICAL HISTORY:  The patient is unable to provide any medical history due to her altered mental status and nonverbal state.  I have noted from reading various prior notes that this patient has a history of atrial fibrillation and a recent acute stroke.    PAST SURGICAL HISTORY:  Hip replacement surgery.    FAMILY HISTORY:  According to prior notes the patient's parents died in old age.  It was uncertain what conditions they had.  The patient is unable to provide this information due to altered mental status.    SOCIAL HISTORY:  The patient does not smoke, use alcohol or drugs.  Up until the very recent past the patient has lived at home  alone until her recent stroke.  As above, this patient arrived from a rehabilitation facility.   MEDICATIONS:  Baby aspirin, diltiazem, folic acid, Lasix, gabapentin, irbesartan, metoprolol, calcium, omeprazole, potassium chloride, senna, MiraLAX, docusate.   ALLERGIES:  No known drug allergies.   PHYSICAL EXAMINATION:  VITAL SIGNS:  Blood pressure 165/89, pulse 75, temperature 97.7. GENERAL:  This patient is nonresponsive to voice.  The patient localizes with her left upper extremity to noxious stimulation and does not withdraw.  There are no voluntary or involuntary movements noted in the right upper or lower extremities, even to noxious stimulation.  The patient is resting with her eyes closed.  EYES:  There is no clear evidence of papilledema.  Funduscopic exam is otherwise unremarkable.  CARDIOVASCULAR:  S1 and S2 heart sounds are normal.  MUSCULOSKELETAL:  The patient's muscle bulk is reduced throughout.  Her tone is within normal limits.  The patient is unable to follow commands through pronator drift due to her altered mental status.  Ambulation testing is deferred as the patient is unresponsive due to altered mental status.  Strength appears to be significantly reduced, but completely absent in the right upper extremity and right lower extremity.  Strength is at least 4+ out of 5 in the left upper and lower extremities.  The patient moves her left upper and lower extremity spontaneously.  She also localizes and crosses midline with her  left upper and lower extremities to identify noxious stimulation on the right side.    MENTAL STATUS:  As above, this patient does not make an effort to interact with her environment besides localizing to noxious stimulation.    CRANIAL NERVES:  Pupils are reactive to light.  Doll sign is negative.  Corneals are present.  The patient will not follow any other commands for cranial nerve testing due to her altered mental status.   SENSATION:  The patient is  able to have sensation in the right upper and lower extremity that she localizes pain in the right upper and lower extremities.    COORDINATION AND CEREBELLAR:  Unable to perform this testing due to the patient's altered mental status.    LABORATORY DATA:  On 03/22/2013, glucose is elevated at 166.  Creatinine is slightly elevated at 1.16.  Sodium and potassium are within normal limits.    IMAGING STUDIES:  Head CT without contrast 03/22/2013, personally reviewed.  There was no new ischemia identified or hemorrhage.  Moderate degree of chronic ischemic white matter changes are seen.  There is an old small lacunar infarct in the anterior limb of the left internal capsule which may explain her right upper and lower extremity weakness.    ASSESSMENT AND RECOMMENDATIONS:  A 81 year old female with a recent stroke on July 13 who was found to have atrial fibrillation, presents with altered mental status and worsening right upper and lower extremity weakness.  Due to her recent stroke the patient was not a candidate for TPA.  Given the new atrial fibrillation, the source of her new symptoms is most likely a new acute stroke, possibly embolic in nature.  Generally with findings of this nature it would be prudent to obtain a transthoracic echocardiogram and a carotid ultrasound.  I would recommend obtaining these records as the patient had a recent stroke workup at the outside hospital.  The patient should be allowed to have permissive hypertension.  She should be continued on antiplatelet therapy with aspirin.  The patient's right upper and lower extremity weakness may be explained by the interior limb of the internal capsule infarction noted on head CT.  It is uncertain why the patient has been having such severe altered mental status.  It is possible that she may have had increased intracranial pressure and swelling as a result of a new acute infarction, however on the head CT there was no clear blurring of the  grey/white junctions and there was no mass effect identified.  As such, I would recommend a routine EEG to make sure that this patient is not suffering from nonconvulsive status epilepticus as the cause for her decreased level of responsiveness.  I have carefully reviewed her labs and there does not appear to be a significant metabolic cause for her altered mental status.  I have also noted that the patient's family has elected to proceed with comfort care measures which I think would also be a perfectly reasonable decision if this task would be consistent with the patient's wishes based on her family's understanding of her goals of care.    ____________________________ Doneta Public. Melrose Nakayama, MD zep:ea D: 03/24/2013 00:05:03 ET T: 03/24/2013 01:18:37 ET JOB#: [id number]  cc: Doneta Public. Melrose Nakayama, MD, <Dictator> Anabel Bene MD ELECTRONICALLY SIGNED 04/12/2013 12:10

## 2014-12-22 NOTE — Consult Note (Signed)
   Comments   Dr Bridgette Habermann and I met with pt's son and daughter in law. Updated him on pt's current medical condition. Reviewed the options of continued aggressive medical therapy vs comfort treatment. Son realizes that pt is likely approaching the end of life and wants to focus on her comfort. We also discussed transfer to [facility] and son is in agreement with this. Ginny Ward, RN, liason for [facility] notified and will see pt.   Electronic Signatures: Burnett Spray, Izora Gala (MD)  (Signed 23-Jul-14 12:27)  Authored: Palliative Care   Last Updated: 23-Jul-14 12:27 by Andriana Casa, Izora Gala (MD)

## 2014-12-22 NOTE — H&P (Signed)
PATIENT NAME:  Renee Leblanc, Renee Leblanc MR#:  [id number] DATE OF BIRTH:  17-Mar-1917  DATE OF ADMISSION:  03/23/2013  PRIMARY CARE PHYSICIAN: Dr. Hilma Favors in [place], [place]. Recently, in the last couple of days admitted to a nursing home under the care of Dr. Frazier Richards; however, he did not see the patient yet.   REFERRING PHYSICIAN: Marta Antu.   CHIEF COMPLAINT: Decreased responsiveness and right sided weakness.   HISTORY OF PRESENT ILLNESS: Renee Leblanc is a 81 year old African American female who had recent stroke with right hemiparesis. Admitted at [facility] in [place]. She was also found to have atrial fibrillation. She was discharged to go for rehabilitation at a local nursing home in this area. Today, the patient was transferred back to the hospital here for evaluation after she was found to have altered mental status. She became nonverbal, and she is not moving the right arm or right leg. Evaluation here is consistent with dense right hemiplegia. The patient has right hemineglect. She looks towards the left side. She does not respond to verbal commands. She has spontaneous movements of the left side. Evaluation at the Emergency Department with CAT scan of the head showed no acute findings. There is evidence of small vessel ischemic disease.   REVIEW OF SYSTEMS: A 10-point system review is unobtainable due to the patient being nonverbal and had acute stroke.   PAST MEDICAL HISTORY: The patient has no prior admissions to this hospital. There are no records. The nursing home sent only the medication list. Historical information is taken from her son who has the power of attorney. He reports she had recent stroke with right hemiparesis affecting partially the right arm and right leg, but she was communicating. It was not until today when she had the acute stroke that then she became nonverbal. Her recent stroke was on July 13th of this month, admitted at [facility] in  [place]. Her other medical problems also include atrial fibrillation and systemic hypertension.   PAST SURGICAL HISTORY: Hip replacement in 2008.   SOCIAL HABITS: Nonsmoker. No history of alcohol or drug abuse. She may drink some alcohol only occasionally.   SOCIAL HISTORY: She is widowed. She used to live at home alone until her recent stroke when she was hospitalized in [place]. Currently, she was at the rehabilitation center.   FAMILY HISTORY: Unremarkable. Both parents died at old age. Her father died at the age of 79. Her mother died at the age of 79.   CURRENT MEDICATIONS: These include aspirin 81 mg a day, vitamin B12 1000 mcg intramuscular once a month, diltiazem 24-hour CD 180 mg once a day, docusate sodium 100 mg twice a day, folic acid once a day, furosemide 20 mg a day, gabapentin 100 mg 3 times a day, irbesartan 150 mg at bedtime, latanoprost 0.005% eyedrops 1 drop in both eyes at bedtime, timolol 0.5% eyedrops 1 drop in each eye once a day, metoprolol tartrate 50 mg twice a day, calcium carbonate 250 mg once a day, omeprazole 20 mg once a day, potassium chloride 20 mEq once a day, MiraLAX 17 g a day, Senna twice a day.   ALLERGIES: No known drug allergies.   PHYSICAL EXAMINATION:  VITAL SIGNS: Blood pressure 165/89, respiratory rate 18, pulse 75, temperature 97.7, oxygen saturation 97%.  GENERAL APPEARANCE: Elderly female lying in bed, turning her face towards the left side. She is nonverbal. No acute distress.  HEAD AND NECK: No pallor. No icterus. No cyanosis. Ear examination: I  could not hear or assess her hearing due to the patient being nonverbal after a stroke. No visible lesions, no discharge. Examination of the nose showed no bleeding, no ulcers, no discharge. Examination of the mouth: The visible part of the lips and tongue appear to be normal. She is edentulous. No ulcers. Eye examination revealed normal eyelids and conjunctivae. She is looking towards the left side.  Pupils are about 4 mm, round and sluggishly reactive to light. Neck is supple. Trachea at midline. No thyromegaly. No cervical lymphadenopathy. No masses.  HEART: Normal S1, S2. Irregular heart sounds. No murmur. No carotid bruits.  RESPIRATORY: Normal breathing pattern without use of accessory muscles. No rales. No wheezing.  ABDOMEN: Soft without tenderness. No hepatosplenomegaly. No masses. No hernias.  SKIN: No ulcers. No subcutaneous nodules. There are superficial skin abrasions around the knee and the shin area on both lower extremities. These appear to be traumatic. Her son is aware about it, and he said this happened about a week ago when she had her first stroke.  MUSCULOSKELETAL: No joint swelling. No clubbing.  NEUROLOGIC: The patient is nonverbal. Does not move the right side of her body. There is facial asymmetry, and the tongue is slightly deviated towards the left side. She has spontaneous movement of her left arm and left leg.  PSYCHIATRIC: Unobtainable due to the patient being nonverbal.   LABORATORY FINDINGS: CAT scan of the head showed no acute intracranial abnormality. There is diffuse cerebral atrophy with hypoattenuation in the periventricular white matter compatible with small vessel ischemia. No hemorrhage or cortical infarct. EKG showed atrial fibrillation with a rate of 108 per minute. Her serum glucose 166, BUN 34, creatinine 1.1, sodium 137, potassium 4.3. Total protein 7.9, albumin 3.5, alkaline phosphatase 148, AST 73, ALT 52. Troponin 0.05. CBC showed white count of 6000, hemoglobin 15, hematocrit 47, platelet count 267. APTT 26. Urinalysis unremarkable.   ASSESSMENT:  1. Acute embolic stroke with resultant dense right hemiplegia. This appears most likely secondary to her atrial fibrillation. However, I cannot rule out thrombotic event.  2. Recent stroke affecting the same side, right-sided, but was partial and she has just right hemiparesis. The patient is right-handed.   3. Atrial fibrillation.  4. Systemic hypertension.   PLAN: Will obtain her records from [facility], including her history and physical, discharge summary, neurology consult, CAT scan and MRI results. Will continue to follow up on her neurologic examination. Will obtain neurology consult with Dr. Melrose Nakayama. I will increase her dose of aspirin from 81 mg to 325 mg. Insert NG tube for medication administration. Continue the diltiazem and the rest of her home medications as listed above. I discussed her condition in detail with her son who has the power of attorney. He indicates that she is FULL CODE, but if her condition deteriorates, he will reconsider reassessing this decision. Her son's name is [employer].   Time spent in evaluating this patient took more than 1 hour and 20 minutes.   ____________________________ Clovis Pu. Lenore Manner, MD amd:gb D: 03/23/2013 00:48:48 ET T: 03/23/2013 01:52:07 ET JOB#: [id number]  cc: Clovis Pu. Lenore Manner, MD, <Dictator> Ellin Saba MD ELECTRONICALLY SIGNED 03/23/2013 4:57

## 2014-12-22 NOTE — Discharge Summary (Signed)
PATIENT NAME:  Renee Leblanc, Renee Leblanc MR#:  [id number] DATE OF BIRTH:  09-21-1916  DATE OF ADMISSION:  03/22/2013 DATE OF DISCHARGE:    CONSULTANTS: Dr. Ermalinda Memos from palliative care, Dr. Melrose Nakayama from neurology and a speech specialist.   CHIEF COMPLAINT: Decreased responsiveness and right-sided weakness.   DISCHARGE DIAGNOSES:  1.  Acute recurrent stroke, likely embolic.  2.  Atrial fibrillation.  3.  Recent stroke and right-sided hemiparesis.  4.  Recent diagnosis of atrial fibrillation.  5.  Hypertension.  She will be getting discharged to hospice home for comfort care measures.   MEDICATIONS: Timolol 0.5 mg ophthalmic solution 1 drop to each eye once a day for glaucoma, lorazepam 0.5 mg injectable every hour as needed for signs of discomfort, glycopyrrolate 0.2 mg injectable every 4 hours as needed for secretions, morphine 20 mg per 5 mL oral solution, please take 2.5 mL every 4 hours as needed for shortness of breath.   CODE STATUS: The patient is DNR.  He is going with continuous oxygen 2 L.   DIET: As tolerated.   ACTIVITY: As tolerated with a Foley.  HISTORY OF PRESENT ILLNESS AND HOSPITAL COURSE: For full details of H and P, please see the dictation on 07/23 by Dr. Lenore Manner, but briefly this is a 81 year old female with a stroke on 07/13 with resultant right-sided hemiparesis, who was also noted to have atrial fibrillation, She was transferred to a rehab facility at [facility]. She came in for worsening of the right-sided weakness and was found to have decreased responsiveness and admitted to the hospitalist service. Initial CAT scan of the head showed no acute findings. The patient was noted to be lethargic and not moving the right side at all and also not moving left lower extremity and likely had another embolic stroke. She, although protecting her airway, did not pass a swallow evaluation and palliative care was consulted given the significant deficits that the patient had. The patient was  seen by Dr. Ermalinda Memos and I had a discussion with the patient's son in conjunction with Dr. Ermalinda Memos. The patient also was seen by Dr. Melrose Nakayama from neurology. At this point, the patient has a very grim prognosis and is unable to take p.o. and will be discharged for palliative care purposes to hospice home after this terminal stroke. On the day of discharge, temperature was 98.1, pulse was 95, respiratory rate was 16, blood pressure was 172/99 last pressure and she is satting 98% on 2 L. Generally, the patient is an elderly female lying in bed, very difficult to arouse. She is leaning towards her right. She does not respond to painful stimuli. Cardiac sounds are irregularly irregular. Lung sounds are coarse. Abdomen is nontender. Extremities without lower extremity edema. She is DNR. She will be transfer to hospice home at 10:00 today.   Total time spent is 32 minutes.  ____________________________ Vivien Presto, MD sa:aw D: 03/24/2013 09:20:31 ET T: 03/24/2013 10:19:48 ET JOB#: [id number]  cc: Vivien Presto, MD, <Dictator> Vivien Presto MD ELECTRONICALLY SIGNED 04/03/2013 11:41
